# Patient Record
Sex: Male | Born: 1945 | Race: White | Hispanic: No | Smoking: Former smoker
Health system: Southern US, Community
[De-identification: ages and names within clinical notes are randomized; demographics above are authoritative.]

## PROBLEM LIST (undated history)

## (undated) DIAGNOSIS — J310 Chronic rhinitis: Secondary | ICD-10-CM

## (undated) DIAGNOSIS — J4520 Mild intermittent asthma, uncomplicated: Secondary | ICD-10-CM

## (undated) DIAGNOSIS — J449 Chronic obstructive pulmonary disease, unspecified: Secondary | ICD-10-CM

## (undated) HISTORY — DX: Chronic obstructive pulmonary disease, unspecified: J44.9

## (undated) HISTORY — DX: Mild intermittent asthma, uncomplicated: J45.20

## (undated) HISTORY — DX: Chronic rhinitis: J31.0

---

## 1999-03-05 ENCOUNTER — Emergency Department (HOSPITAL_COMMUNITY): Admission: EM | Admit: 1999-03-05 | Discharge: 1999-03-06 | Payer: Self-pay | Admitting: Emergency Medicine

## 1999-03-06 ENCOUNTER — Encounter: Payer: Self-pay | Admitting: Emergency Medicine

## 2000-04-11 ENCOUNTER — Encounter: Payer: Self-pay | Admitting: Emergency Medicine

## 2000-04-11 ENCOUNTER — Inpatient Hospital Stay (HOSPITAL_COMMUNITY): Admission: EM | Admit: 2000-04-11 | Discharge: 2000-04-13 | Payer: Self-pay | Admitting: Emergency Medicine

## 2000-06-22 ENCOUNTER — Emergency Department (HOSPITAL_COMMUNITY): Admission: EM | Admit: 2000-06-22 | Discharge: 2000-06-22 | Payer: Self-pay | Admitting: Emergency Medicine

## 2000-06-22 ENCOUNTER — Encounter: Payer: Self-pay | Admitting: Emergency Medicine

## 2000-09-06 ENCOUNTER — Encounter: Admission: RE | Admit: 2000-09-06 | Discharge: 2000-09-06 | Payer: Self-pay | Admitting: Internal Medicine

## 2001-09-20 ENCOUNTER — Encounter: Payer: Self-pay | Admitting: Emergency Medicine

## 2001-09-20 ENCOUNTER — Inpatient Hospital Stay (HOSPITAL_COMMUNITY): Admission: EM | Admit: 2001-09-20 | Discharge: 2001-09-25 | Payer: Self-pay | Admitting: Emergency Medicine

## 2002-12-09 ENCOUNTER — Encounter: Payer: Self-pay | Admitting: Emergency Medicine

## 2002-12-09 ENCOUNTER — Emergency Department (HOSPITAL_COMMUNITY): Admission: EM | Admit: 2002-12-09 | Discharge: 2002-12-09 | Payer: Self-pay | Admitting: Emergency Medicine

## 2003-01-27 ENCOUNTER — Encounter: Payer: Self-pay | Admitting: Emergency Medicine

## 2003-01-27 ENCOUNTER — Inpatient Hospital Stay (HOSPITAL_COMMUNITY): Admission: EM | Admit: 2003-01-27 | Discharge: 2003-02-01 | Payer: Self-pay | Admitting: Emergency Medicine

## 2004-02-04 ENCOUNTER — Inpatient Hospital Stay (HOSPITAL_COMMUNITY): Admission: EM | Admit: 2004-02-04 | Discharge: 2004-02-06 | Payer: Self-pay | Admitting: Emergency Medicine

## 2005-05-17 ENCOUNTER — Emergency Department (HOSPITAL_COMMUNITY): Admission: EM | Admit: 2005-05-17 | Discharge: 2005-05-17 | Payer: Self-pay | Admitting: Emergency Medicine

## 2005-11-29 ENCOUNTER — Emergency Department (HOSPITAL_COMMUNITY): Admission: EM | Admit: 2005-11-29 | Discharge: 2005-11-29 | Payer: Self-pay | Admitting: Family Medicine

## 2006-01-24 ENCOUNTER — Emergency Department (HOSPITAL_COMMUNITY): Admission: EM | Admit: 2006-01-24 | Discharge: 2006-01-24 | Payer: Self-pay | Admitting: Family Medicine

## 2006-02-11 ENCOUNTER — Inpatient Hospital Stay (HOSPITAL_COMMUNITY): Admission: EM | Admit: 2006-02-11 | Discharge: 2006-02-13 | Payer: Self-pay | Admitting: Emergency Medicine

## 2006-02-11 ENCOUNTER — Encounter (INDEPENDENT_AMBULATORY_CARE_PROVIDER_SITE_OTHER): Payer: Self-pay | Admitting: Specialist

## 2006-09-07 ENCOUNTER — Ambulatory Visit: Payer: Self-pay | Admitting: Internal Medicine

## 2006-12-20 ENCOUNTER — Emergency Department (HOSPITAL_COMMUNITY): Admission: EM | Admit: 2006-12-20 | Discharge: 2006-12-20 | Payer: Self-pay | Admitting: Family Medicine

## 2007-02-13 ENCOUNTER — Ambulatory Visit: Payer: Self-pay | Admitting: Internal Medicine

## 2007-03-16 ENCOUNTER — Ambulatory Visit: Payer: Self-pay | Admitting: Internal Medicine

## 2007-04-27 ENCOUNTER — Ambulatory Visit: Payer: Self-pay | Admitting: Internal Medicine

## 2007-06-18 ENCOUNTER — Emergency Department (HOSPITAL_COMMUNITY): Admission: EM | Admit: 2007-06-18 | Discharge: 2007-06-18 | Payer: Self-pay | Admitting: Emergency Medicine

## 2007-07-22 ENCOUNTER — Emergency Department (HOSPITAL_COMMUNITY): Admission: EM | Admit: 2007-07-22 | Discharge: 2007-07-22 | Payer: Self-pay | Admitting: Emergency Medicine

## 2007-10-09 ENCOUNTER — Telehealth (INDEPENDENT_AMBULATORY_CARE_PROVIDER_SITE_OTHER): Payer: Self-pay | Admitting: *Deleted

## 2007-10-09 ENCOUNTER — Encounter: Payer: Self-pay | Admitting: Internal Medicine

## 2007-10-11 HISTORY — PX: CHOLECYSTECTOMY: SHX55

## 2007-10-12 ENCOUNTER — Telehealth (INDEPENDENT_AMBULATORY_CARE_PROVIDER_SITE_OTHER): Payer: Self-pay | Admitting: *Deleted

## 2007-12-28 ENCOUNTER — Ambulatory Visit: Payer: Self-pay | Admitting: Internal Medicine

## 2007-12-28 DIAGNOSIS — J310 Chronic rhinitis: Secondary | ICD-10-CM

## 2007-12-28 DIAGNOSIS — J4550 Severe persistent asthma, uncomplicated: Secondary | ICD-10-CM

## 2008-01-17 ENCOUNTER — Ambulatory Visit: Payer: Self-pay | Admitting: Internal Medicine

## 2008-09-19 ENCOUNTER — Telehealth (INDEPENDENT_AMBULATORY_CARE_PROVIDER_SITE_OTHER): Payer: Self-pay

## 2010-12-04 ENCOUNTER — Inpatient Hospital Stay (HOSPITAL_COMMUNITY)
Admission: EM | Admit: 2010-12-04 | Discharge: 2010-12-05 | DRG: 192 | Discharge status: Against Medical Advice | Payer: Self-pay | Attending: Family Medicine | Admitting: Family Medicine

## 2010-12-04 ENCOUNTER — Emergency Department (HOSPITAL_COMMUNITY): Payer: Self-pay

## 2010-12-04 DIAGNOSIS — Z87891 Personal history of nicotine dependence: Secondary | ICD-10-CM

## 2010-12-04 DIAGNOSIS — J441 Chronic obstructive pulmonary disease with (acute) exacerbation: Principal | ICD-10-CM | POA: Diagnosis present

## 2010-12-04 DIAGNOSIS — Z9119 Patient's noncompliance with other medical treatment and regimen: Secondary | ICD-10-CM

## 2010-12-04 DIAGNOSIS — I959 Hypotension, unspecified: Secondary | ICD-10-CM | POA: Diagnosis present

## 2010-12-04 DIAGNOSIS — Z91199 Patient's noncompliance with other medical treatment and regimen due to unspecified reason: Secondary | ICD-10-CM

## 2010-12-04 DIAGNOSIS — D72829 Elevated white blood cell count, unspecified: Secondary | ICD-10-CM | POA: Diagnosis present

## 2010-12-04 LAB — LACTIC ACID, PLASMA: Lactic Acid, Venous: 3.5 mmol/L — ABNORMAL HIGH (ref 0.5–2.2)

## 2010-12-04 LAB — POCT I-STAT 3, ART BLOOD GAS (G3+)
O2 Saturation: 90 %
TCO2: 25 mmol/L (ref 0–100)
pO2, Arterial: 61 mmHg — ABNORMAL LOW (ref 80.0–100.0)

## 2010-12-04 LAB — D-DIMER, QUANTITATIVE: D-Dimer, Quant: 1.76 ug/mL-FEU — ABNORMAL HIGH (ref 0.00–0.48)

## 2010-12-04 LAB — DIFFERENTIAL
Basophils Absolute: 0 10*3/uL (ref 0.0–0.1)
Basophils Relative: 0 % (ref 0–1)
Eosinophils Absolute: 0.4 10*3/uL (ref 0.0–0.7)
Eosinophils Relative: 3 % (ref 0–5)
Lymphocytes Relative: 8 % — ABNORMAL LOW (ref 12–46)
Lymphs Abs: 1.1 10*3/uL (ref 0.7–4.0)
Monocytes Absolute: 0.3 10*3/uL (ref 0.1–1.0)
Monocytes Relative: 2 % — ABNORMAL LOW (ref 3–12)
Neutro Abs: 11.7 10*3/uL — ABNORMAL HIGH (ref 1.7–7.7)
Neutrophils Relative %: 87 % — ABNORMAL HIGH (ref 43–77)

## 2010-12-04 LAB — POCT CARDIAC MARKERS
CKMB, poc: 1.5 ng/mL (ref 1.0–8.0)
Troponin i, poc: <0.05 ng/mL (ref 0.00–0.09)

## 2010-12-04 LAB — CBC
Hemoglobin: 14.9 g/dL (ref 13.0–17.0)
MCH: 29.6 pg (ref 26.0–34.0)
MCHC: 33.2 g/dL (ref 30.0–36.0)
RBC: 5.04 MIL/uL (ref 4.22–5.81)

## 2010-12-04 LAB — COMPREHENSIVE METABOLIC PANEL
Albumin: 3.7 g/dL (ref 3.5–5.2)
Calcium: 8.6 mg/dL (ref 8.4–10.5)
Potassium: 4.3 mEq/L (ref 3.5–5.1)
Sodium: 140 mEq/L (ref 135–145)
Total Protein: 6.5 g/dL (ref 6.0–8.3)

## 2010-12-04 LAB — URINALYSIS, ROUTINE W REFLEX MICROSCOPIC
Bilirubin Urine: NEGATIVE
Nitrite: NEGATIVE
Urobilinogen, UA: 0.2 mg/dL (ref 0.0–1.0)

## 2010-12-04 LAB — POCT I-STAT, CHEM 8
Calcium, Ion: 1.04 mmol/L — ABNORMAL LOW (ref 1.12–1.32)
Creatinine, Ser: 1.2 mg/dL (ref 0.4–1.5)
HCT: 44 % (ref 39.0–52.0)
Potassium: 4.2 mEq/L (ref 3.5–5.1)

## 2010-12-04 LAB — TROPONIN I
Troponin I: 0.04 ng/mL (ref 0.00–0.06)
Troponin I: 0.14 ng/mL — ABNORMAL HIGH (ref 0.00–0.06)

## 2010-12-04 LAB — BRAIN NATRIURETIC PEPTIDE: Pro B Natriuretic peptide (BNP): 54 pg/mL (ref 0.0–100.0)

## 2010-12-04 LAB — CK TOTAL AND CKMB (NOT AT ARMC): CK, MB: 3.6 ng/mL (ref 0.3–4.0)

## 2010-12-05 ENCOUNTER — Inpatient Hospital Stay (HOSPITAL_COMMUNITY): Payer: Self-pay

## 2010-12-05 DIAGNOSIS — R079 Chest pain, unspecified: Secondary | ICD-10-CM

## 2010-12-05 LAB — CARDIAC PANEL(CRET KIN+CKTOT+MB+TROPI)
Relative Index: INVALID (ref 0.0–2.5)
Total CK: 58 U/L (ref 7–232)
Troponin I: 0.12 ng/mL — ABNORMAL HIGH (ref 0.00–0.06)

## 2010-12-05 LAB — COMPREHENSIVE METABOLIC PANEL
ALT: 15 U/L (ref 0–53)
AST: 24 U/L (ref 0–37)
BUN: 12 mg/dL (ref 6–23)
Calcium: 8.7 mg/dL (ref 8.4–10.5)
GFR calc Af Amer: >60 mL/min (ref >60)
Potassium: 3.6 mEq/L (ref 3.5–5.1)
Total Bilirubin: 0.4 mg/dL (ref 0.3–1.2)
Total Protein: 5.4 g/dL — ABNORMAL LOW (ref 6.0–8.3)

## 2010-12-05 LAB — CBC
MCH: 28.8 pg (ref 26.0–34.0)
WBC: 9.6 10*3/uL (ref 4.0–10.5)

## 2010-12-05 LAB — LACTIC ACID, PLASMA: Lactic Acid, Venous: 5 mmol/L — ABNORMAL HIGH (ref 0.5–2.2)

## 2010-12-05 LAB — TSH: TSH: 0.57 u[IU]/mL (ref 0.350–4.500)

## 2010-12-06 LAB — URINE CULTURE

## 2010-12-06 NOTE — Consult Note (Signed)
NAME:  NEO, YEPIZ              ACCOUNT NO.:  [id number]  MEDICAL RECORD NO.:  [id number]           PATIENT TYPE:  I  LOCATION:  4707                         FACILITY:  [facility]  PHYSICIAN:  Noralyn Pick. Eden Emms, MD, FACCDATE OF BIRTH:  06/04/1946  DATE OF CONSULTATION: DATE OF DISCHARGE:  12/05/2010                                CONSULTATION   Mr. Billy [name] is a 64-year patient we are asked to see for chest pain. He was admitted last night for shortness of breath.  The patient has a longstanding history of COPD and is followed by Dr. Shelle Iron and [name]. He is a previous smoker.  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  He subsequently says he got anxious and had chest pressure in the setting of his wheezing and shortness of breath.  He also became somewhat diaphoretic.  EMS was called, then he came to the ER.  He had significant improvement with bronchodilator treatments; however, he was admitted to the hospital due to a positive troponin and high lactic acid level.  The patient is currently pain free.  He still has some mild wheezing. He has no insurance and wants to go home.  His D-dimer was mildly elevated and the primary service ordered a CT angiogram, which the patient refused to go down for.  The patient has no previous history of cardiac problems.  He has no exertional chest pain.  Coronary risk factors include previous smoking.  The patient denies palpitations, syncope, lower extremity edema.  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  His 10-point review of systems in otherwise negative.  In particular, he has not had sputum production or fever.  He indicates that he usually has a good response with prednisone.  PAST MEDICAL HISTORY:  Remarkable for COPD.  PREVIOUS SURGICAL HISTORY:  Cholecystectomy.  Only medications taken prior to admission were albuterol p.r.n.  FAMILY  HISTORY:  Negative for premature coronary artery disease.  The patient quit smoking in 2010.  He works at a Estate manager/land agent.  He denies current alcohol or drug use.  He is sedentary.  He does not have insurance.  He has no known allergies.  PHYSICAL EXAMINATION:  GENERAL:  Remarkable for middle-aged male in no distress. VITAL SIGNS:  Current blood pressure is 130/59, pulse 81 and regular, afebrile, 95% room air sats. HEENT:  Unremarkable. NECK:  Carotids upstrokes are normal without bruit.  No lymphadenopathy, thyromegaly, or JVP elevation. LUNGS:  Have mid-to-end-expiratory wheezes.  Diaphragmatic motion is normal. CARDIAC:  S1-S2, normal heart sounds.  PMI normal. ABDOMEN:  Benign.  Bowel sounds positive.  No AAA.  No tenderness.  No bruit.  No hepatosplenomegaly, hepatojugular reflux, or tenderness. Status post cholecystectomy. EXTREMITIES:  Distal pulses are intact.  No edema. NEUROLOGIC:  Nonfocal. SKIN:  Warm and dry.  No muscular weakness.  Potassium level 3.6, BUN 12, creatinine 1.  Cardiac panel shows a troponin of 0.12, but CPK 58 with negative MB and CPK of 70 and 3.6.  BNP is 54.  EKG shows sinus rhythm and is normal.  Initially, somewhat tachycardic, no evidence acute ischemic changes.  Chest x-ray shows chronically prominent markings at the lung bases.  No acute pneumonia or CHF.  White count 9.6, hematocrit 36.7.  IMPRESSION:  Clinically, the patient has had a chronic obstructive pulmonary disease exacerbation.  He has responded to bronchodilators, pressure was only in the setting of an anxiety attack because he could not breath.  CPKs and EKG are normal.  Exam is unremarkable and the patient is improved with treatment for his asthmatic component.  Particularly since he has no insurance and wants to be discharge before the time period for 24-hour observation, I think it is fine to discharge the patient home, I do not think he needs further cardiac workup.   I would send him home on a pred pack or steroid taper and follow up with Dr. Shelle Iron and [name].  He was encouraged to avoid overheating and to set of fan or thermostat while at work.  These recommendations were discussed with Dr. Mena Pauls from the Primary Service.     Noralyn Pick. Eden Emms, MD, [facility]     PCN/MEDQ  D:  12/05/2010  T:  12/05/2010  Job:  [id number]  Electronically Signed by Charlton Haws MD [facility] on 12/06/2010 05:00:35 PM

## 2010-12-06 NOTE — H&P (Signed)
NAME:  Billy [name], Billy [name] NO.:  [id number]  MEDICAL RECORD NO.:  [id number]           PATIENT TYPE:  E  LOCATION:  [facility]                         FACILITY:  [facility]  PHYSICIAN:  Eduard Clos, MDDATE OF BIRTH:  [birth year]  DATE OF ADMISSION:  12/04/2010 DATE OF DISCHARGE:                             HISTORY & PHYSICAL   PRIMARY CARE PHYSICIAN:  Unassigned.  PRIMARY PULMONOLOGIST:  Charlaine Dalton. [name], MD, FCCP  CHIEF COMPLAINT:  Shortness of breath.  HISTORY OF PRESENT ILLNESS:  A 65 year old male with history of COPD and history of cigarette smoking, who presents with sudden onset of chest compression and shortness of breath with diaphoresis.  The patient states he was doing perfectly fine until 3:30 evening and he suddenly developed shortness of breath, chest compression as of his chest is being squeezed, and he became diaphoretic.  All incident lasts for around half an hour.  He came to the ER, by the time he got couple of treatments, he got better.  At this time, the patient is found to be mildly hypotensive, lactic acid is high, and the patient will be admitted for further observation.  The patient denies any cough or phlegm or fever or chills.  Denies any chest pain at this time.  Denies any abdominal pain, nausea, vomiting, diarrhea, dysuria, discharge, or any dizziness, loss of consciousness or focal deficit.  PAST MEDICAL HISTORY:  COPD.  PAST SURGICAL HISTORY:  Cholecystectomy.  MEDICATIONS PRIOR TO ADMISSION:  He takes albuterol p.r.n.  FAMILY HISTORY:  Patient states is negative for any diabetes, hypertension, stroke, or cancer.  SOCIAL HISTORY:  The patient quit smoking 2 years ago.  Denies any alcohol or drug abuse.  REVIEW OF SYSTEMS:  As per history of presenting illness, nothing else significant.  ALLERGIES:  NO KNOWN DRUG ALLERGIES.  PHYSICAL EXAMINATION:  GENERAL:  The patient examined at bedside, not in acute distress. VITAL  SIGNS:  Blood pressure 90/70, pulse is 100 per minute, temperature 97.8, respirations 18, O2 sat 97%. HEENT:  Anicteric.  No pallor.  No discharge from ears, eyes, nose or mouth.  No facial asymmetry.  Tongue is midline. CHEST:  Bilateral air entry present. Bilateral expiratory wheezes. No crepitation or adventitious sounds heard. ABDOMEN:  Soft, nontender.  Bowel sounds heard. CNS:  The patient is alert, awake, and oriented to time, place, and person.  Moves upper and lower extremities, 5/5. EXTREMITIES:  Peripheral pulses felt.  No edema.  LABORATORY DATA:  I ordered an EKG.  Chest x-ray shows chronically prominent markings of the lung bases unchanged from multiple previous exam, therefore, acute pneumonia is not suspected.  CBC; WBC is 13.4, hemoglobin is 15, hematocrit is 44, platelets 234,000, neutrophils 87%. Complete metabolic panel; sodium 142, potassium 4.2, chloride 106, carbon dioxide 26, glucose 121, BUN 18, creatinine 1.2, total bilirubin is 0.2, alkaline phosphatase is 85, AST 23, albumin 3.5, calcium 8.6, lactic acid is 3.5, troponin-I is 0.04, and less than 0.05.  BNP is 54.  Blood cultures are pending.  ASSESSMENT: 1. Shortness of breath, most likely could be a chronic obstructive     pulmonary  disease exacerbation. 2. Chest discomfort, rule out acute coronary syndrome. 3. Hypotension, unclear etiology. 4. Leukocytosis.  PLAN: 1. At this time, I am going to admit the patient to telemetry. 2. For his hypotension, I am going to give a liter bolus followed by     fluid infusion.  If he continues to be hypotensive, then I will     consider transferring him to step-down, as now, the patient does     not look very toxic, empirically start the patient on     Avelox for now.  We will get blood cultures, urine cultures,      We will get a CT angio chest to rule out PE. 3. Chest discomfort.  At this time, the patient is free of any chest     pain.  I will keep the patient  on aspirin, cycle cardiac markers,     got EKG, got a 2D echo. 4. Further recommendation based on the test order and clinical course.     Eduard Clos, MD     ANK/MEDQ  D:  12/04/2010  T:  12/04/2010  Job:  [id number]  Electronically Signed by Midge Minium MD on 12/06/2010 05:09:10 PM

## 2010-12-10 LAB — CULTURE, BLOOD (ROUTINE X 2): Culture  Setup Time: 201202252105

## 2010-12-11 LAB — CULTURE, BLOOD (ROUTINE X 2): Culture: NO GROWTH

## 2010-12-16 NOTE — Discharge Summary (Signed)
NAME:  Billy [name], Billy [name] NO.:  [id number]  MEDICAL RECORD NO.:  [id number]           PATIENT TYPE:  I  LOCATION:  4707                         FACILITY:  [facility]  PHYSICIAN:  Brendia Sacks, MD    DATE OF BIRTH:  12/28/1945  DATE OF ADMISSION:  12/04/2010 DATE OF DISCHARGE:  12/05/2010                              DISCHARGE SUMMARY   The patient left against medical advice, February 26.  PRIMARY CARE PHYSICIAN:  The patient has none.  PRIMARY PULMONOLOGIST:  Charlaine Dalton. [name], MD, FCCP  BRIEF SUMMARY:  This is a 65 year old man who presented to the emergency room with shortness of breath and chest tightness.  He had been doing well at work when all of a sudden he became quite hot, diaphoretic, and had acute shortness of breath and chest tightness.  He felt like he was having a panic attack and this was made worse by fact his albuterol inhaler was empty.  He presented to the emergency room and felt better after nebulizer treatments.  However, he was noted to have marked abnormalities including a positive D-dimer as well as a markedly elevated lactic acid.  He was also mildly hypotensive in the emergency room.  He was therefore admitted for further evaluation and treatment to exclude several acute issues.  The patient remained stable overnight and felt quite well the morning of February 26.  He was seen in consultation with Cardiology who noted his mildly elevated troponin but felt he had no significant EKG changes and felt no further evaluation was required.  I reviewed the patient's studies which included an unremarkable chest x-ray and EKG was unremarkable.  However, his laboratory studies were notable for a lactic acid of 3.5 which then this morning was 5.0, although procalcitonin was negative.  His D-dimer was noted to be elevated at 1.76 and his cardiac enzymes were notable for troponin peak of 0.14, most recently 0.12 this morning with normal CK and  CK-MB.  The patient denied any risk factors for venous thromboembolism including recent surgery, immobilization, or personal history of blood clots.  The patient had no chest pain when I saw him this morning and no shortness of breath and wanted to go home.  I discussed his abnormalities including his elevated lactic acid which may indicate severe acute illness such as developing sepsis or other severe illness, noninfectious in nature, as well as mildly elevated troponins and a positive D-dimer concerning for a venous thromboembolism.  I discussed continued observation with further testing including CT angiogram of the chest to rule out pulmonary embolism, 2-D echocardiogram to assess heart function, and bilateral lower extremity venous Dopplers to exclude DVT. I discussed all these in layman's term with him and my rationale for them.  I did not feel it was advisable for discharge today.  However, the patient after careful consideration and discussion with his son has decided to sign out against medical advice.  I have urged him to seek medical attention should he have any recurrent symptoms and he appears to understand and accept the risks of discharge against medical advice.  DISCHARGE DIAGNOSES: 1. Chest pain  and shortness of breath. 2. Noncompliance with chronic obstructive pulmonary disease. 3. Positive troponin. 4. Positive lactic acid. 5. Positive D-dimer.     Brendia Sacks, MD     DG/MEDQ  D:  12/05/2010  T:  12/06/2010  Job:  [id number]  cc:   Charlaine Dalton. [name], MD, [facility]  Electronically Signed by Brendia Sacks  on 12/15/2010 09:05:19 PM

## 2011-02-22 NOTE — Assessment & Plan Note (Signed)
[facility]                             PULMONARY OFFICE NOTE   ARIC, JOST                     MRN:          [id number]  DATE:03/16/2007                            DOB:          1946-02-25    HISTORY:  This is a 65 year old white male who returns for PFTs as  requested, status post remote smoking cessation with minimum COPD  chronically, and an asthmatic component that typically is fueled by  poorly controlled rhinitis/sinusitis.  He has had difficulty with  adherence that he relates previously to being able to afford his  insurance and medicines, but now comes back having misunderstood  instructions that he was given here in the office on Feb 13, 2007 in  terms of how to use Afrin in combination with nasal steroids.  Instead  of using them for 5 days, he has been using them on a daily basis.  However, he said he feels the best he has felt in a long time.  The  only time he felt better than this is when he got prednisone in high  doses for a long period of time from his ears, nose, and throat doctor.   PHYSICAL EXAMINATION:  He is a pleasant, ambulatory, white male, in no  acute distress.  Stable vital signs.  HEENT:  Unremarkable.  Oropharynx clear.  LUNG FIELDS:  Reveal minimal rhonchi bilaterally.  HEART:  Reveals a regular rhythm without murmur, gallop, or rub.  ABDOMEN:  Soft and benign.  EXTREMITIES:  Warm without calf tenderness, cyanosis, clubbing, or  edema.   PFTs were reviewed with the patient.  He had an FEV1 of 70% predicted  with a ratio of 57%.   IMPRESSION:  This patient has an element of chronic obstructive  pulmonary disease with minimum asthma.  Since the cost is such an issue  for him, I believe that we can continue to treat his rhinitis as the  primary problem, and then if he breaks the rule of 2's, add back a  maintenance inhaler.  Because compliance is such an issue, I would  probably pick Symbicort for him.   I went over in very explicit terms, the rule of 2's to understand the  difference between controlled and uncontrolled asthma component.  I also  went over how to use the Afrin more appropriately, and showed him the  same flyer I gave him last time, indicating in very bold letters that he  should only use the Afrin for 5 days.   In addition, I reminded him that we have a 3 to 5 day rule in  pulmonary medicine where he has typically between 3 and 5 days when he  begins to flare, that he can contact this office for followup or same  day appointment if necessary to prevent recurrent trips to the hospital.   Followup here will be every 3 months, sooner if needed.     Charlaine Dalton. [name], MD, [facility]  Electronically Signed    MBW/MedQ  DD: 03/17/2007  DT: 03/17/2007  Job #: [id number]   cc:   Georgann Housekeeper, MD

## 2011-02-22 NOTE — Assessment & Plan Note (Signed)
[facility]                             PULMONARY OFFICE NOTE   NIGUEL, MOURE                     MRN:          [id number]  DATE:04/27/2007                            DOB:          01/07/1946    PULMONARY EXTENDED ACUTE OFFICE VISIT:   HISTORY:  A 65 year old white male who has evidence of mild-to-moderate  COPD but no significant asthma by most recent studies available from  June, 2008 with symptoms that correlate very well with  rhinitis/sinusitis.  For that reason, I emphasized to him twice in  writing that he should maintain a floor of taking one dose of Nasonex  nightly and that if he flared, he should not only double the dose to  b.i.d. but take Afrin for five days.   For reasons that are not clear to me, the patient continues to follow  these instructions and comes in now with a 24-hour history of increasing  dyspnea associated again with nasal congestion with purulent sputum.  His specific request is that we give me a nebulizer because the last  time I had one the benefits lasted five days.   The patient does have a rescue inhaler and actually has not used it at  all today.   PHYSICAL EXAMINATION:  He is a pleasant, ambulatory white male in no  acute distress.  Afebrile with normal vital signs.  HEENT:  Severe turbinate edema with crusting.  The nasal passages are  extremely limited.  Oropharynx, however, is clear with no evidence of  excessive postnasal drainage.  NECK:  Supple without cervical adenopathy or tenderness.  Trachea is  midline.  No thyromegaly.  Lung fields are perfectly clear bilaterally to auscultation and  percussion with excellent air movement.  HEART:  Regular rate and rhythm without murmur, rub or gallop.  ABDOMEN:  Soft, benign.  EXTREMITIES:  Warm without calf tenderness, clubbing, cyanosis or edema.   IMPRESSION:  Chronic obstructive pulmonary disease with an asthmatic  component that is being driven  almost entirely by rhinitis/sinusitis.  Although we could certainly treat the asthma component more  aggressively, I prefer to treat the underlying problem with the thinking  being that if all we do is bronchodilate the airway, although we will be  able to limit the bronchial reflex, we will not be able to solve the  underlying problem and actually may make it worse if he gets direct  injury into his airways from retained purulent secretions in the upper  airway.   Therefore, I recommend the following approach.  I got out the rhinitis  flyer that we previously gave him, and we went over it line by line to  show him where he was mistaken in terms of how he managed his problem  chronically.  Specifically, he is to taper the Nasonex down but not off  and to use Afrin for five straight days for flare-up, as in today.   I gave him the squirt gun analogy of nasal steroids, emphasizing that  to put out a flame with a squirt gun, one needs to aim carefully and  regularly at the base of the flame, and that is not possible to do once  he has an acute flareup with nasal obstruction.   To treat him acutely today, I have recommended Augmentin for 10 days and  prednisone for eight days as well.  If not back to baseline in terms of  symptoms and if any further need for albuterol over baseline, we will  need to see him back here in the office, otherwise I have referred him  back to Dr. [name] for followup.     Charlaine Dalton. [name], MD, [facility]  Electronically Signed    MBW/MedQ  DD: 04/27/2007  DT: 04/28/2007  Job #: [id number]   cc:   Kristine Garbe. [name], M.D.

## 2011-02-25 NOTE — H&P (Signed)
[facility]  Patient:    Billy [name], HARBOR Visit Number: [id number] MRN: [id number]          Service Type: MED Location: 3W [phone] Attending Physician:  Avie Echevaria Dictated by:   Charlaine Dalton. [name], M.D. [facility] Admit Date:  09/20/2001   CC:         Tyson Dense, M.D.   History and Physical  CHIEF COMPLAINT:  Dyspnea.  HISTORY OF PRESENT ILLNESS:  Billy [name] is a 65 year old white male, a remote smoker, whom I have seen previously with intermittent asthma that usually flares up in the setting of rhinitis/sinusitis for which he had been referred to [name]. Billy [name], M.D.  The only set of PFTs that we have on our records from October of 2001 indicate no evidence of air flow obstruction.  He had been able to do well, however, using essentially no medications since he was last seen here in the office in June of 2002, but comes in acutely ill today with a 24-hour history of worsening sinus congestion, sore throat, chest congestion with now a hacking coughing, and generalized diffuse anterior chest discomfort with coughing paroxysms.  He was seen by the emergency room staff and found to have a right lower lobe pneumonia with borderline saturations and increased work of breathing at rest even after albuterol treatment.  He was therefore referred to me for admission Tyson Dense, M.D., is his primary physician).  The patient denies any localized pleuritic chest pain.  His cough is productive of minimal yellow sputum.  He denies any toothache, definite sinus pain, nausea, vomiting, myalgias, arthralgias, leg swelling, or diarrhea.  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  ALLERGIES:  None known.  MEDICATIONS:  He is on no regular medications at all.  SOCIAL HISTORY:  He quit smoking in 1999 after smoking three to four packs per day for 30 years.  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 He denies any unusual travel, pet, or hobby exposure.  He is physically active without dyspnea, but his not aerobically active.  FAMILY HISTORY:  Negative for respiratory diseases.  REVIEW OF SYSTEMS:  Taken in detail from the work sheet and essentially negative except as noted above.  PHYSICAL EXAMINATION:  This is an acutely ill-appearing white male sitting back on the stretcher at less than 30 degrees elevated head of bed.  VITAL SIGNS:  He is afebrile in the emergency room with normal vital signs, except for a respiratory rate of 28 and a pulse rate of 120.  HEENT:  The oropharynx reveals mild erythema.  There are no tonsillar exudates or enlargement, however.  There is no evidence of postnasal drainage.  The nasal turbinates reveal moderate erythema and edema with no evidence of purulent discharge.  Ear canals clear bilaterally.  LUNGS:  The lung fields reveal inspiratory and expiratory rhonchi bilaterally with overall very diminished air movement.  There is a regular rate and rhythm without murmurs, rubs, or gallops present.  ABDOMEN:  Soft and benign with no palpable organomegaly, masses, or tenderness.  EXTREMITIES:  Warm without calf tenderness, clubbing, cyanosis, or edema.  NEUROLOGIC:  No focal deficits.  Reflexes and sensorium were intact.  SKIN:  Warm and dry.  LABORATORY DATA:  The laboratory data available to me was consistent with a bronchopneumonia involving the right lower lobe anterior basal segment.  He had a white count of 16,700 with a left  shift and normal otherwise.  The EKG was normal.  His hemoglobin saturation on nasal oxygen at 2 L is running in the low 90s and was in the 80s on room air.  IMPRESSION:  Acute tracheobronchitis in a patient with intermittent asthma that typically flares with rhinitis/sinusitis.  It has now flared with what appears to be a bacterial bronchopneumonia involving the right lower lobe complicated by hypoxemia  respiratory failure at rest.  Given the fact that he has increased work of breathing with evidence of persistent rhonchi despite treatment with a nebulizer here in the hospital and borderline saturations, I am going to recommend that he be admitted to the hospital for treatment which will include IV bronchodilators, IV steroids, IV Tequin, and around-the-clock bronchodilators. Dictated by:   Charlaine Dalton. [name], M.D. [facility] Attending Physician:  Avie Echevaria DD:  09/20/01 TD:  09/20/01 Job: [id number]

## 2011-02-25 NOTE — Discharge Summary (Signed)
[place]. [facility]  Patient:    SEVERO, BEBER                     MRN: [id number] Adm. Date:  [id number] Disc. Date: 04/13/00 Attending:  Darnelle Bos CC:         Winn Jock. Earl Gala, M.D.                           Discharge Summary  ADMITTING DIAGNOSIS:  Asthmatic attack.  DISCHARGE DIAGNOSES: 1. Acute asthmatic attack. 2. Asthma and probable allergic rhinitis. 3. Evidence of chronic obstructive pulmonary disease by chest x-ray. 4. History of steroid abuse. 5. History of nephrectomy.  Please see admitting History and Physical for admitting details.  HOSPITAL COURSE:  The patient was admitted and treated with IV Solu-Medrol and albuterol nebulizers.  No antibiotics were added as there was no significant evidence of infection.  Over the subsequent 24-36 hours, his wheezing improved considerably and he was able to rest reasonably well.  He was able to ambulate without excessive shortness of breath and was discharged in improved condition.  DISCHARGE MEDICATIONS: 1. Pepcid AC one tablet b.i.d. x 2 weeks. 2. Allegra 180 one daily. 3. Prednisone 20 mg two daily for one week, then one daily for one week. 4. Advair 250/50 one inhalation every 12 hours. 5. Albuterol inhaler two puffs q.i.d. p.r.n.  ACTIVITY:  No restrictions.  SPECIAL INSTRUCTIONS:  He may return to work on 04/17/00.  He was instructed to call if he developed any significant problems with wheezing or shortness of breath.  DIET:  No restrictions.  FOLLOWUP:  He will call to make an appointment to see me in approximately two weeks. DD:  04/13/00 TD:  04/13/00 Job: [id number]

## 2011-02-25 NOTE — H&P (Signed)
[place]. [facility]  Patient:    Billy [name], Billy [name]                     MRN: [id number] Adm. Date:  [id number] Attending:  Darnelle Bos                         History and Physical  CHIEF COMPLAINT: Billy [name] is a 65 year old white male admitted with chronic obstructive pulmonary disease exacerbation versus asthma.  HISTORY OF PRESENT ILLNESS: Approximately three months ago he began to have problems with nasal congestion, ear fullness, and intermittent chest congestion.  These symptoms actually finally resolved, but a week ago the pains began to worsen.  He went to the [place] [facility] at [facility], where he saw a P.A.  He was given a Z-pak when his chest x-ray looked good.  He finished it last night.  At approximately midnight last night he suddenly developed shortness of breath and also became hot and sweaty, and literally had to stand up to breathe more comfortably.  He came to the emergency room via ambulance.  He has received several breathing treatments, and is feeling better.  He smoked 3-1/2 to 5 packs of cigarettes per day from age 49 to age 67, and quit cold [place] at that time.  He has had no breathing problems since he quit smoking until the last three months.  He has also noted some sinus fullness and nasal congestion this evening.  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  PAST MEDICAL HISTORY: Negative.  MEDICATIONS: None.  ALLERGIES: No known drug allergies.  SOCIAL HISTORY: Cigarettes, see above.  Alcohol, infrequent.  He is Customer service manager.  FAMILY HISTORY: Father died of complications of apparent legionnaires disease. Mother alive and well at approximately age 65.  Brother alive and well.  REVIEW OF SYSTEMS: Otherwise negative.  PHYSICAL EXAMINATION:  VITAL SIGNS: Blood pressure 143/83, pulse 100, respiratory rate 24.  HEENT: PERRL.  EOMI.  Funduscopic  examination is normal.  Ears normal.  Nose and throat unremarkable except for slightly poor dentition.  NECK: Supple.  Thyroid not enlarged or tender.  CHEST: Decreased breath sounds, with a few squeaks around.  CARDIAC: Normal.  ABDOMEN: Negative, with normal bowel sounds.  No hepatosplenomegaly or mass.  EXTREMITIES: Without clubbing, cyanosis, or edema.  NEUROLOGIC: Oriented x 3.  Cranial nerves intact.  Motor is 5/5.  LABORATORY DATA: Chest x-ray shows mild hyperinflation without infiltrate.  WBC 12,000, hemoglobin normal.  CMET is pending.  IMPRESSION/PLAN:  1. Asthma and allergic rhinitis.  This appears to be recurring rhinitis     with asthma.  This is probably superimposed on top of some chronic     obstructive pulmonary disease.  Will treat him with intravenous     Solu-Medrol and nebulizers today and then switch to meter dose inhalers     and prednisone tomorrow.  No antibiotic at this point.  Will also add     Allegra.  2. Chronic obstructive pulmonary disease by chest x-ray.  Will need to have     pulmonary function tests done after hospitalization.  3. History of cigarette abuse.  4. Status post nephrectomy. DD:  04/11/00 TD:  04/11/00 Job: [id number]

## 2011-02-25 NOTE — H&P (Signed)
NAME:  Billy [name], Billy [name] NO.:  [id number]   MEDICAL RECORD NO.:  [id number]                   PATIENT TYPE:  EMS   LOCATION:  ED                                   FACILITY:  [facility]   PHYSICIAN:  Oley Balm. Sung Amabile, M.D. [facility]          DATE OF BIRTH:  [birth year]   DATE OF ADMISSION:  01/27/2003  DATE OF DISCHARGE:                                HISTORY & PHYSICAL   CHIEF COMPLAINT:  Shortness of breath, cough, and wheezing.   HISTORY OF PRESENT ILLNESS:  The patient is a 65 year old former smoker of  two packs per day for 40 years who quit 10 years ago.  He was admitted in  the past for acute exacerbation of chronic obstructive pulmonary disease and  found to have sinusitis.  Was evaluated by Suzanna Obey, M.D.  He refused  surgery at that time.  He presents today with a four day history of  increasing shortness of breath.  Shortness of breath is with any activity.  He also notes sinus drainage, chest congestion.  No wheeze.  He has proven  refractory to treatment in emergency room and will be admitted for further  evaluation and treatment.   PAST MEDICAL HISTORY:  1. Left nephrectomy for hydronephrosis at age 7.  2. Chronic obstructive pulmonary disease.   ALLERGIES:  None.   MEDICATIONS:  Albuterol p.r.n. puffer which he uses approximately once a  month.   SOCIAL HISTORY:  He was a smoker, but quit in 1999 after smoking three to  four packs a day for 30 years.  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  He is non-active.   FAMILY HISTORY:  Negative for respiratory diseases.   REVIEW OF SYSTEMS:  Taken in detail and essentially unremarkable.   LABORATORY DATA:  WBC 12.6, hemoglobin 15.1, hematocrit 43.8, platelets  305,000.  Sodium 141, potassium 3.4, chloride 106, CO2 30, glucose 130, BUN  18, creatinine 1.1, calcium 10.6.  Chest x-ray shows no acute disease.   PHYSICAL EXAMINATION:  VITAL SIGNS:   Blood pressure originally 213/117, now  is 140/80, heart rate 112, respirations 18, temperature 96.7, O2 saturations  on admission 81% on room air, currently are 93% on 2 L nasal cannula.   IMPRESSION AND PLAN:  Acute exacerbation of chronic obstructive pulmonary  disease with questionable sinusitis admitted with usual pharmaceutical  regimens including of intravenous steroids, intravenous antibiotics,  handheld nebulizers along with nasal hygiene since he has a history of  sinusitis.  Will also have chest x-ray along with sinus films to evaluate  history of sinusitis.   MEDICATIONS:  1. Albuterol 2.5 handheld nebulizer q.6h. or q.3h. p.r.n.  2. Atrovent 0.5 mg handheld nebulizer q.6h.  3. Solu-Medrol 80 mg IV q.8h.  4. Avelox 400 mg IV daily.  5. Protonix 40 mg daily.  6. Afrin nasal spray.  7. Flonase nasal spray.  8. Nasal salt water per standard regimen.  9. Ambien 10 mg q.h.s. p.r.n.  10.      Tylenol 650 mg p.o. q.i.d.     Billy [name], A.C.N.P. [facility]                 Oley Balm. Sung Amabile, M.D. [facility]    SM/MEDQ  D:  01/27/2003  T:  01/27/2003  Job:  [id number]   cc:   Georgann Housekeeper, M.D.  [address]  [place]  [place] [address]  Fax: [phone]   Suzanna Obey, M.D.  [address] [place]  [place] [address]  Fax: [phone]

## 2011-02-25 NOTE — Discharge Summary (Signed)
[facility]  Patient:    Billy [name], Billy [name] Visit Number: [id number] MRN: [id number]          Service Type: MED Location: 3W [phone] Attending Physician:  Avie Echevaria Dictated by:   Charlaine Dalton. [name], M.D. [facility] Admit Date:  09/20/2001 Discharge Date: 09/25/2001   CC:         Billy Banda. Jearld [name], M.D., ENT                           Discharge Summary  FINAL DIAGNOSIS:   Acute asthmatic bronchitis secondary to acute-on-chronic rhinitis/sinusitis with possible right lower lobe pneumonia.  Pansinusitis documented by a CT scan this admission with acute-on-chronic features. Blood cultures negative, no sputum obtained.  HISTORY OF PRESENT ILLNESS:  This is a 65 year old white male remote smoker with a previous documented nonobstructive PFTs with intermittent episodes of asthmatic bronchitis that I had previously felt were probably related to sinusitis and he had been seen by Dr. Jearld [name] but never required surgery.  He was admitted through the emergency room on December 12 with a less than 24-hour history of worsening sinus congestion, sore throat, and chest congestion, with severe coughing paroxysms and was felt to have a possible right lower lobe pneumonia with desaturations on room air.  Although he had very poor air movement, he had no exam findings to suggest consolidation and chest x-ray was read as possible bronchiectasis involving the anterior basal segment of the right lower lobe.  He had an initial white count of 16,000 with a left shift.  His T max in the hospital was around 100 degrees.  He responded slowly to empiric antibiotic therapy with Tequin and, once it was realized he had evidence of pansinusitis, this was switched over to Augmentin, so he received a total of 5 of 14 planned days of therapy.  He was treated with IV Solu-Medrol that was switched over to prednisone the day prior to discharge and switched from nebulizers over to Advair also the  day prior to discharge.  On the day of discharge, he as saturating well on room air, comfortable with no further coughing.  His sinus congestion was addressed with Afrin plus Nasarel and he will be discharged on Nasarel two puffs b.i.d. with follow-up in the office to be sent back to Dr. Jearld [name] once we have his acute exacerbation resolved.  He is therefore discharged in improved condition on the following medications: 1. Protonix 40 mg daily. 2. Guaifenesin DM two b.i.d. 3. Nasarel two puffs b.i.d. each nostril. 4. Augmentin 875 b.i.d. for an additional nine days. 5. Advair 250/50 one b.i.d. 6. Prednisone 20 mg two each morning for two days, one each morning for two    days, and then one-half in the morning until seen. 7. For wheezing symptomatically, he can take Combivent two puffs every 4 hours    as needed.  He is to see our nurse practitioner on December 26 or December 27 and then see me in three weeks for referral back to Dr. Jearld [name] depending on his progress. He will need a follow-up chest x-ray, as well, in the office.  His diet and activity are to be routine. Dictated by:   Charlaine Dalton. [name], M.D. [facility] Attending Physician:  Avie Echevaria DD:  09/25/01 TD:  09/25/01 Job: [id number]

## 2011-02-25 NOTE — Discharge Summary (Signed)
NAMEFOCH, ROSENWALD NO.:  [id number]   MEDICAL RECORD NO.:  [id number]          PATIENT TYPE:  INP   LOCATION:  1603                         FACILITY:  [facility]   PHYSICIAN:  Velora Heckler, MD      DATE OF BIRTH:  03/10/1946   DATE OF ADMISSION:  02/10/2006  DATE OF DISCHARGE:  02/13/2006                                 DISCHARGE SUMMARY   REASON FOR ADMISSION:  Acute cholecystitis, abdominal pain.   HISTORY OF PRESENT ILLNESS:  The patient is 65 year old white male from  [place], [place].  He presents to the emergency department with  abdominal pain in the right upper quadrant.  The patient was evaluated and  noted to have an elevated white blood cell count.  The patient underwent  ultrasound which showed a thick-walled gallbladder with multiple gallstones.   HOSPITAL COURSE:  The patient was seen and evaluated in the emergency  department on May4, 2007.  He was prepared and taken to the operating room  on May5.  He underwent laparoscopic cholecystectomy with intraoperative  cholangiography.  Postoperatively, the patient received intravenous  antibiotics for 48 hours.  His diet was slowly advanced.  He was prepared  for discharge home on the second postoperative day.   DISCHARGE PLANNING:  The patient is discharged home today, Feb 13, 2006, in  good condition, tolerating a regular diet and ambulating independently.  The  patient will be seen back in my office at [facility] in 2  weeks.  Discharge medications include Vicodin as needed for pain and Milk of  Magnesia as needed for constipation.   FINAL DIAGNOSIS:  Acute cholecystitis, cholelithiasis.   CONDITION AT DISCHARGE:  Improved.      Velora Heckler, MD  Electronically Signed     TMG/MEDQ  D:  02/13/2006  T:  02/14/2006  Job:  [id number]   cc:   Charlaine Dalton. [name], M.D. [facility]  [address]  [place]  [place] [address]

## 2011-02-25 NOTE — Op Note (Signed)
NAMECAMILLE, THAU NO.:  [id number]   MEDICAL RECORD NO.:  [id number]          PATIENT TYPE:  INP   LOCATION:  1603                         FACILITY:  [facility]   PHYSICIAN:  Velora Heckler, MD      DATE OF BIRTH:  12-28-1945   DATE OF PROCEDURE:  02/11/2006  DATE OF DISCHARGE:                                 OPERATIVE REPORT   PREOPERATIVE DIAGNOSIS:  Acute cholecystitis, cholelithiasis.   POSTOPERATIVE DIAGNOSIS:  Acute cholecystitis, cholelithiasis.   PROCEDURE:  Laparoscopic cholecystectomy with intraoperative  cholangiography.   SURGEON:  Velora Heckler, MD, FACS   ASSISTANT:  Glenna Fellows, MD, FACS   ANESTHESIA:  General per Dr. Helane Rima.   ESTIMATED BLOOD LOSS:  Minimal.   PREPARATION:  Betadine.   COMPLICATIONS:  None.   INDICATIONS:  The patient is a 65 year old white male from [place],  [place] who presents emergency department with abdominal pain.  The  patient had a similar episode 2 weeks ago.  Laboratory studies showed  elevated white count of 13.9.  Ultrasound of the abdomen was obtained and  showed a thickened gallbladder wall with multiple gallstones.  General  surgery was consulted and the patient was prepared for the operating room.   BODY OF REPORT:  Procedure was done in OR #1 at [facility].  The patient is brought to the operating room, placed in supine  position on the operating room table.  Following administration of general  anesthesia, the patient is prepped and draped in usual strict aseptic  fashion.  After ascertaining that an adequate level of anesthesia been  obtained, an infraumbilical incision is made a #15 blade.  Dissection was  carried down through subcutaneous tissues.  Fascia is incised in the  midline.  The peritoneal cavity was entered cautiously.  The 0 Vicryl  pursestring suture is placed in the fascia.  A Hasson cannula was introduced  and secured with a  pursestring suture.  The abdomen was insufflated carbon  dioxide.  Laparoscope was introduced and the abdomen explored.  There was a  tense, distended, edematous gallbladder in the right upper quadrant with  adherent omentum.  Operative ports were placed along the right costal margin  in the midline, midclavicular line, anterior axillary line.  Fundus of the  gallbladder was grasped and retracted cephalad.  Gallbladder wall is quite  friable.  It easily tears and gallbladder drains a clear mucoid liquid  consistent with milk bile.  Dissection at the neck of the gallbladder was  begun.  With some difficulty the neck of the gallbladder was dissected out.  A 1 cm gallstone is extracted from the distal gallbladder.  Cystic artery  was doubly clipped and divided.  Further dissection distally reveals a quite  friable and fragile gallbladder wall.  An attempt was made at  cholangiography but the catheter only refluxes saline upon its installation.  Further dissection revealed she had another gallstone in the distal  gallbladder or proximal cystic duct.  This was also extracted.  Cystic duct  is then dissected out.  The [facility]  cholangiography catheter was again inserted  into the cystic duct and secured with a Ligaclip.  This time real time  cholangiography was performed.  Good representative radiographs were taken.  The common bile duct is normal caliber.  There is free flow of contrast  distally into the duodenum without filling defect or obstruction.  There is  reflux of contrast into the right and left hepatic ductal systems.  A Cook  catheter was withdrawn.  Cystic duct was then triply clipped and divided.  Gallbladder was excised from the gallbladder bed using hook electrocautery  for hemostasis.  Posterior venous tributaries were also divided between  Ligaclips.  The entire gallbladder was extracted as were the prior two  gallstones.  They are placed into an EndoCatch bag and removed through  the  umbilical port from the abdominal cavity.  Right upper quadrant was  copiously irrigated with warm saline which was evacuated.  Good hemostasis  was noted.  0 Vicryl pursestring sutures tied securely at the umbilicus.  Pneumoperitoneum was released and ports were removed under direct vision.  Port sites show good hemostasis.  All ports were removed.  All port sites  were anesthetized with local anesthetic.  All wounds were closed with  interrupted 4-0 Vicryl subcuticular sutures.  Wounds washed and dried.  Benzoin and Steri-Strips were applied.  Sterile dressings were applied.  The  patient is awakened from anesthesia and brought to the recovery room in  stable condition.  The patient tolerated the procedure well.      Velora Heckler, MD  Electronically Signed     TMG/MEDQ  D:  02/11/2006  T:  02/13/2006  Job:  [id number]   cc:   Charlaine Dalton. [name], M.D. [facility]  [address]  [place]  [place] [address]

## 2011-02-25 NOTE — H&P (Signed)
NAME:  Billy [name], Billy [name] NO.:  [id number]   MEDICAL RECORD NO.:  [id number]          PATIENT TYPE:  INP   LOCATION:  0101                         FACILITY:  [facility]   PHYSICIAN:  Velora Heckler, MD      DATE OF BIRTH:  Sep 03, 1946   DATE OF ADMISSION:  02/10/2006  DATE OF DISCHARGE:                                HISTORY & PHYSICAL   REFERRING PHYSICIAN:  Dr. Freddrick March, emergency department.   PRIMARY PHYSICIAN:  Dr. Sandrea Hughs, [facility].   CHIEF COMPLAINT:  Abdominal pain.   HISTORY OF PRESENT ILLNESS:  The patient is 65 year old white male from  [place], [place] who presents with abdominal pain in the right  upper quadrant.  The patient experienced onset of pain approximately 6 p.m.  on May4th.  This was severe.  It was associated with nausea.  He denies  fevers or chills.  The patient had a similar such episode approximately two  weeks ago and had been seen at the Urgent [facility] Center at [facility].  At  that time, he was placed on Levsin.  The patient was seen and evaluated in  the emergency department.  He was noted to have an elevated white blood cell  count of 13,000 with a left shift.  Abdominal ultrasound was performed which  demonstrated a thick-walled gallbladder with multiple gallstones.  General  surgery was consulted for management.   The patient has no prior history of hepatobiliary disease.  He has no  history of jaundice or acholic stools.  He denies fevers or chills.  The  patient does have a significant history of COPD.  He is admitted at this  time for management of acute cholecystitis and cholelithiasis.   PAST MEDICAL HISTORY:  1.  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  2.  History of COPD followed by Dr. Sandrea Hughs.   MEDICATIONS:  Hyoscyamine, albuterol, Advair Diskus.   ALLERGIES:  None known.   SOCIAL HISTORY:  The patient is an out of work Nutritional therapist.  He is  divorced.  He  has two children.  He quit smoking 25 years ago.  He denies  alcohol use.   FAMILY HISTORY:  Noncontributory.  No history of gallbladder disease in  immediate family members.   REVIEW OF SYSTEMS:  15-system review without significant other findings.   EXAM:  In general, a 65 year old white male on a stretcher in the emergency  department mild discomfort.  Temperature 97.3, pulse 75, respirations 16,  blood pressure 107/66.  HEENT shows him to be normocephalic, atraumatic.  Sclerae clear.  Conjunctiva clear.  Pupils equal and reactive.  Dentition  good.  Mucous membranes moist.  Voice normal.  Palpation of the neck shows  no thyroid nodularity.  No lymphadenopathy.  No tenderness.  No mass.  Lungs  are clear to auscultation bilaterally without rales, rhonchi or wheeze.  Cardiac exam shows regular rate and rhythm without murmur.  Peripheral  pulses are full.  Abdomen is soft without distension.  Bowel sounds are  present.  There is a left  flank incision which is well-healed.  Palpation  reveals mild tenderness in the right upper quadrant.  There is no mass.  There is no guarding.  There is no Murphy's sign.  There is no sign of  hernia.  Extremities are nontender without edema.  Neurologically, the  patient is alert and oriented without focal neurologic deficit.   LABORATORY STUDIES:  White count 13.9, hemoglobin 14.0, hematocrit 41.9%,  platelet count 316,000.  Differential shows 91% neutrophils, 6% lymphocytes.  Electrolytes were normal.  Liver function tests were normal.  Amylase is 56,  lipase 22.   Radiographic studies:  Ultrasound of the abdomen shows thick-walled  gallbladder with multiple gallstones.   IMPRESSION:  Acute cholecystitis, cholelithiasis.   PLAN:  1.  Admission to [facility].  2.  Initiation of intravenous antibiotics.  3.  To operating room for laparoscopic cholecystectomy.  4.  Routine postoperative care.   I discussed with the patient  the indications for surgery.  I explained to  him the technique of laparoscopic cholecystectomy.  I explained the  potential for conversion to open surgery.  We discussed his hospital stay.  He understands and wishes to proceed.      Velora Heckler, MD  Electronically Signed     TMG/MEDQ  D:  02/11/2006  T:  02/11/2006  Job:  [id number]   cc:   Charlaine Dalton. [name], M.D. [facility]  [address]  [place]  [place] [address]

## 2011-02-25 NOTE — Assessment & Plan Note (Signed)
[facility]                             PULMONARY OFFICE NOTE   Billy [name], Billy [name]                     MRN:          [id number]  DATE:02/13/2007                            DOB:          [birth year]    HISTORY:  A 65 year old white male, former smoker, with a baseline FEV1  of around 2.8 documented May 2005 and intermittent asthma, typically  occurring during exacerbation of rhinitis.  He also has evidence of  polyps and is supposed to be maintained on nasal steroids in the form of  Nasonex 2 puffs b.i.d. but ran out several weeks ago and comes in today  with increasing dyspnea associated with subjective wheezing and nasal  congestion with no purulent secretions.  He states the only thing that  makes him completely better is prednisone and his last course was 2  months ago.  Since that time, he has noticed only a minimal increased  need for albuterol, but not typically more than twice weekly and denies  any nocturnal exacerbations of wheezing or cough.   PHYSICAL EXAMINATION:  He is a stoic, ambulatory white male in no acute  distress.  He is afebrile with stable vital signs.  HEENT:  Severe turbinate edema with possible polyps bilaterally with  marked edema and minimal cyanosis.  No purulent secretions.  Oropharynx  is clear.  Dentition intact.  NECK:  Supple without cervical adenopathy, tenderness.  Trachea was  midline. No thyromegaly or masses.  LUNG FIELDS:  Perfectly clear bilaterally to auscultation and  percussion.  Regular rhythm without murmur, gallop or rub.  ABDOMEN:  Soft, benign.  EXTREMITIES:  Warm without calf tenderness, cyanosis, clubbing or edema.   IMPRESSION:  1. Most of his problems are rhinitis in nature and certainly this can      lead to asthma exacerbation, but I do not believe he needs any      maintenance therapy for asthma as long as he does not break the      rule of 2's and I reviewed this with him in detail.  2. To reverse his poorly controlled rhinitis, I have recommended a      prednisone Dosepak, but emphasized the importance of using Nasonex      appropriately and gave him samples plus a prescription for refills      and asked him to use Afrin for the first 5 days to help the Nasonex      penetrate to the target tissues and follow up with Dr. [name].  We      actually have had a similar conversation before, during which I      emphasized to him that if he controls rhinitis aggressively and      effectively, he can probably avoid the need for any form of      maintenance inhaler for asthma, but rather just use the albuterol      on a p.r.n. basis.   I spent extra time with this patient, 15-20 minutes going over a chronic  rhinitis flyer, including both text and graphic formatted material,  regarding the nature  of the condition and how to help the medication  reach the target tissue with the use of 5 day Afrin.     Charlaine Dalton. [name], MD, [facility]  Electronically Signed    MBW/MedQ  DD: 02/13/2007  DT: 02/14/2007  Job #: [id number]   cc:   Kristine Garbe. [name], M.D.

## 2011-02-25 NOTE — Discharge Summary (Signed)
. [facility]  Patient:    Billy [name], Billy [name]                     MRN: [id number] Adm. Date:  [id number] Disc. Date: 04/13/00 Attending:  Darnelle Bos CC:         Winn Jock. Earl Gala, M.D.                           Discharge Summary  ADDENDUM   The patient had elevated liver function tests of mild degree.  AST was 62, ALT 76 and Alk phos 130.  This will be followed up as an outpatient.  In addition, we will need to obtain pulmonary function tests as an outpatient as well. DD:  04/13/00 TD:  04/13/00 Job: [id number]

## 2011-02-25 NOTE — Discharge Summary (Signed)
NAME:  Billy [name], Billy [name]                        ACCOUNT NO.:  [id number]   MEDICAL RECORD NO.:  [id number]                   PATIENT TYPE:  INP   LOCATION:  0378                                 FACILITY:  [facility]   PHYSICIAN:  Charlaine Dalton. [name], M.D. [facility]           DATE OF BIRTH:  01-25-1946   DATE OF ADMISSION:  01/27/2003  DATE OF DISCHARGE:  02/01/2003                                 DISCHARGE SUMMARY   FINAL DIAGNOSIS:  1. Chronic obstructive pulmonary disease with refractory asthma felt to be     secondary to acute on chronic sinusitis.  2. Status post left nephrectomy for hydronephrosis age 36.   HISTORY:  This is a 65 year old white male former smoker with COPD with an  asthmatic component that is felt to be triggered by sinusitis. He had been  evaluated by Dr. [name] who recommended surgery but he refused. He says he  overall had been doing well until 4-5 days prior to admission with increased  need for albuterol over baseline secondary to dyspnea associated with a  congested cough but not really bringing up any purulent sputum. He also  noticed nasal congestion.   HOSPITAL COURSE:  He was admitted by Dr. Sung Amabile on April 19 and put on the  usual COPD medications. His differential did not show any significant  eosinophilia. A sinus CT scan showed worsening sinus disease since previous  study dated September 21, 2001.   We therefore treated him aggressively for COPD with an asthmatic component  and also for sinusitis with topical therapy in the form of Flonase and  Afrin. He gradually improved but was still O2 dependent on the 23rd.  The  plan was to wean his oxygen off by the 24th and discharge him on the  following regimen.   DISCHARGE MEDICATIONS:  1. Prednisone 20 mg, 2 b.i.d. for 3 days and then 2 q.a.m. for 3 days and     then 1 q.a.m. until seen.  2. Avelox 400 mg daily for 7 days more of therapy (to complete at least 10     days before reevaluating).  3. Singulair 10  mg q.p.m.  4. Flonase 2 puffs b.i.d.  5. Guiafenesin DM 2 b.i.d.  6. Protonix 40 mg b.i.d. (may substitute Prilosec OTC).  7. Advair 250/50, 1 b.i.d.  8. Combivent or albuterol 2 puffs every 4 hours as needed.  9. Allegra D 1 q. 12 p.r.n. nasal congestion.   Followup in one week.   At the time of discharge, he still has a few wheezes but he is comfortable  with no significant dyspnea with slow ADLs. His chest x-ray shows no  infiltrates.   His condition therefore is felt to be markedly improved. His diet is to be  regular and his activity is to be progressive activity as tolerated.   We will arrange for Dr. [name] to see him in followup as well.  Charlaine Dalton. [name], M.D. [facility]    MBW/MEDQ  D:  01/31/2003  T:  01/31/2003  Job:  [id number]   cc:   Suzanna Obey, M.D.  [address] [place]  [place] [address]  Fax: [phone]

## 2011-02-25 NOTE — Discharge Summary (Signed)
NAME:  Billy, [name]                        ACCOUNT NO.:  [id number]   MEDICAL RECORD NO.:  [id number]                   PATIENT TYPE:  INP   LOCATION:  5705                                 FACILITY:  [facility]   PHYSICIAN:  Casimiro Needle B. [name], M.D. [facility]           DATE OF BIRTH:  1946/02/18   DATE OF ADMISSION:  02/03/2004  DATE OF DISCHARGE:  02/06/2004                                 DISCHARGE SUMMARY   DISCHARGE DIAGNOSIS:  Acute exacerbation of asthma secondary to  pansinusitis.   HISTORY OF PRESENT ILLNESS:  Mr. Billy [name] is a 65 year old white male with  known chronic obstructive pulmonary disease and asthma, who presented to the  emergency department via emergency medical service with a 2-day history of  increasing shortness of breath and chest tightness, as well as restriction  of air flow.  He has noticed wheezing and rattling as well as a dry cough  but denied any purulence.  He has been on Advair in the past but due to the  fact that he felt so good, he quit taking it, and although he has been  utilizing his albuterol puffer p.r.n., he did not correlate the fact that  his pulmonary status had declined.  Despite aggressive treatments with  nebulizers and steroids in the emergency room, he proved refractory to  treatment and was admitted for further evaluation and treatment.   LABORATORY DATA:  Sodium 137, potassium 3.1, chloride 103, CO2 of 33,  glucose 149, BUN is 14, creatinine 1.1.  Calcium is 1.1.  WBC is 14.9,  hemoglobin is 16.1 and hematocrit if 46.4; platelets are 320,000.  PCO2 of  43, PO2 of 114, pH is 7.37, with a bicarb of 25.   RADIOGRAPHIC DATA:  CT of maxillofacial area without contrast shows changes  of pansinusitis.   HOSPITAL COURSE:  PROBLEM #1 - ACUTE EXACERBATION OF CHRONIC OBSTRUCTIVE  PULMONARY DISEASE AND ASTHMATIC BRONCHITIS WITH A PANSINUSITIS TRIGGER:  After being treated with IV steroids, IV antibiotics and nebulized  bronchodilators, he reached  maximal hospital benefit by February 06, 2004.  CT  scan had shown pansinusitis and he was placed on Augmentin 875 mg b.i.d. for  the next 21 days.  An appointment has been made with him to see Dr.  Kristine Garbe. [name] in the near future of the otolaryngology service.  He  will be placed on a steroid taper and will return to his Advair 250/50 and  has been admonished not to stop his medication in the future.  He is being  discharged home in improved condition.   DISCHARGE MEDICATIONS:  1. Advair 250/50 mcg 1 puff b.i.d.  2. Albuterol MDI 1-2 puffs p.r.n. as a rescue medication.  Of note,     extensive time was used to explain to him the power of rescue medication     functions.  3. Augmentin 875 mg b.i.d. until gone for 21 days.  4. Nasal salt  water 2 puffs 3 times a day.  5. Nasonex 2 puffs 2 times a day.  6. Afrin 2 puffs 2 times a day for 3 more days and then stop.  7. He can have either Protonix or Pepcid while on antimicrobial therapy;     again, this was explained to him in great detail.  8. Prednisone a taper -- 40 mg for 3 days, 30 mg for 3 days, 20 mg for 3     days, 10 mg for 3 days and then stop.   DIET:  His diet is no restrictions.   FOLLOWUP:  He had a followup appointment with Dr. [name] as instructed and  Dr. Charlaine Dalton. Wert on Feb 18, 2004 at 10:20 a.m.   DISPOSITION/CONDITION ON DISCHARGE:  Improved.      Billy [name], A.C.N.P. [facility]                 Charlaine Dalton. [name], M.D. [facility]    SM/MEDQ  D:  02/06/2004  T:  02/06/2004  Job:  [id number]   cc:   Kristine Garbe. [name], M.D.  [address]  [place] [address]  Fax: [phone]

## 2011-02-25 NOTE — Assessment & Plan Note (Signed)
Billy [name]                             PULMONARY OFFICE NOTE   Billy [name], Billy [name]                     MRN:          [id number]  DATE:09/07/2006                            DOB:          Mar 14, 1946    PULMONARY/FOLLOWUP OFFICE VISIT   HISTORY:  A 65 year old white male who ran out of medical insurance and  has not been back for 2 years but has been followed previously for COPD  with rhinitis and an asthmatic component that typically is triggered by  rhinitis.  He is apparently working with Dr. [name] to control rhinitis,  which continues to be problematic with intermittent dyspnea and cough  for which he uses p.r.n. albuterol on the average more than twice  weekly.  He typically does not, however, wake up from his sleep and need  any albuterol.  Presently he is on prednisone from Dr. [name] for  treatment of rhinitis.   PHYSICAL EXAMINATION:  GENERAL:  He is a pleasant, ambulatory white male  who does not appear cushingoid.  He is afebrile with normal vital signs.  HEENT:  Remarkable for moderate turbinate edema.  Oropharynx clear.  LUNG FIELDS:  Reveal distant expiratory wheeze bilaterally.  HEART:  There is a regular rhythm without murmur, gallop, or rub.  ABDOMEN:  Soft, benign.  EXTREMITIES:  Warm without calf tenderness, cyanosis, clubbing, or  edema.   IMPRESSION:  Chronic obstructive pulmonary disease with indefinite  asthmatic component that may be triggered by rhinitis, but even on  prednisone is actively wheezing now and needing more than two albuterol  rescue treatments per week.  He would therefore fit the criteria for at  least moderate asthma and recommended therefore reinitiation of Advair  250/50 b.i.d. perfectly regularly, and I reminded him again that  albuterol treats symptoms not the underlying problem.  He needs to work  aggressively to control rhinitis sinusitis through Dr. [name] help,  and then return here in 6 weeks  for PFTs to make sure we have optimized  his lung function and the reversible component of his asthma.   I spent a extra time reviewing these issues with him and also  documenting that he could use DPI with coaching up to 90% effectiveness.     Billy Dalton. [name], MD, [facility]  Electronically Signed    MBW/MedQ  DD: 09/07/2006  DT: 09/08/2006  Job #: [id number]

## 2011-02-25 NOTE — H&P (Signed)
NAME:  Billy [name], Billy [name] NO.:  [id number]   MEDICAL RECORD NO.:  [id number]                   PATIENT TYPE:  EMS   LOCATION:  MAJO                                 FACILITY:  [facility]   PHYSICIAN:  Marcelyn Bruins, M.D. [facility]              DATE OF BIRTH:  [birth year]   DATE OF ADMISSION:  02/03/2004  DATE OF DISCHARGE:                                HISTORY & PHYSICAL   HISTORY OF PRESENT ILLNESS:  The patient is a 65 year old white male, with  known COPD and asthma, who presents to the emergency room tonight with a two  day history of increasing shortness of breath with chest tightness as well  as restriction of air flow.  He has noticed worsening wheezing, and  rattling, as well as a dry cough but denies any purulence.  His nose is  congested as it always is; however, there are no acute sinusitis symptoms.  The patient states that he stopped taking his Advair because I felt so  good.  He has been using p.r.n. albuterol.  He did not call the office to  see if he could have a work-in visit today.  Despite aggressive use of  nebulizers and steroids in the emergency room, he is not better, and  therefore, will need to be admitted for further therapy.   PAST MEDICAL HISTORY:  1. Left nephrectomy, at age 11, secondary to hydronephrosis.  2. History of chronic obstructive pulmonary disease as stated above.   MEDICATIONS:  His usual medications include:  1. Advair of unknown strength one b.i.d.  2. Albuterol p.r.n.   He has no known drug allergies.   SOCIAL HISTORY:  He has a long history of smoking large amounts until 1999  when he quit.   FAMILY HISTORY:  Noncontributory.   REVIEW OF SYSTEMS:  As per history of present illness.   PHYSICAL EXAMINATION:  GENERAL:  He is an obese white male in mild  respiratory distress at this time.  VITAL SIGNS:  Blood pressure 114/75, pulse is 120.  His respiratory rate was  25-30.  He is afebrile.  O2 saturation, on oxygen,  shows a sat of 97-98%.  HEENT:  Pupils equal, round and reactive to light and accommodation.  Extraocular muscles were intact.  Nares patent without discharge.  Oropharynx is clear.  NECK:  Supple without JVD or lymphadenopathy.  There is no palpable  thyromegaly.  CHEST:  Reveals diffuse inspiratory and expiratory wheezes but reasonable  air flow.  There is no pseudo-wheezing.  CARDIAC:  Reveals tachycardia with a regular rhythm.  ABDOMEN:  Soft, nontender with good bowel sounds.  GENITAL:  Not done, not indicated.  RECTAL:  Not done, not indicated.  BREAST:  Not done, not indicated.  EXTREMITIES:  Lower extremities are without edema.  Good pulses distally.  There is no calf tenderness.  NEUROLOGIC:  He is alert and oriented with no obvious motor  deficits.   LABORATORY DATA:  Chest x-ray, labs, and EKG were all at time of dictation.  Arterial blood gas, on oxygen of unknown liter flow, shows a pO2 of 114,  pCO2 44, pH 7.38.   IMPRESSION:  1. Chronic obstructive pulmonary disease/asthma with acute exacerbation     secondary to medical noncompliance.  The patient clearly has acute     bronchospasm at this time and will need to be admitted.  He does not seem     to have an infectious component that has triggered this but rather airway     inflammation brought on by medical noncompliance.  2. Medical noncompliance.   PLAN:  1. We will admit to the regular floor for further nebulizer treatments as     well as steroids.  2. No antibiotics currently.  3. DVT prophylaxis.   The patient before discharge will need asthma education.                                                Marcelyn Bruins, M.D. [facility]    KC/MEDQ  D:  02/03/2004  T:  02/04/2004  Job:  [id number]   cc:   Charlaine Dalton. [name], M.D. [facility]

## 2011-04-05 ENCOUNTER — Observation Stay (HOSPITAL_COMMUNITY)
Admission: EM | Admit: 2011-04-05 | Discharge: 2011-04-05 | Disposition: A | Discharge status: Home / Self Care | Payer: Self-pay | Attending: Emergency Medicine | Admitting: Emergency Medicine

## 2011-04-05 ENCOUNTER — Emergency Department (HOSPITAL_COMMUNITY): Payer: Self-pay

## 2011-04-05 DIAGNOSIS — J449 Chronic obstructive pulmonary disease, unspecified: Secondary | ICD-10-CM | POA: Insufficient documentation

## 2011-04-05 DIAGNOSIS — R0609 Other forms of dyspnea: Secondary | ICD-10-CM | POA: Insufficient documentation

## 2011-04-05 DIAGNOSIS — R0989 Other specified symptoms and signs involving the circulatory and respiratory systems: Secondary | ICD-10-CM | POA: Insufficient documentation

## 2011-04-05 DIAGNOSIS — J4489 Other specified chronic obstructive pulmonary disease: Principal | ICD-10-CM | POA: Insufficient documentation

## 2011-04-05 LAB — BASIC METABOLIC PANEL
BUN: 14 mg/dL (ref 6–23)
CO2: 29 mEq/L (ref 19–32)
Chloride: 102 mEq/L (ref 96–112)
Creatinine, Ser: 0.88 mg/dL (ref 0.50–1.35)
Glucose, Bld: 98 mg/dL (ref 70–99)

## 2011-04-05 LAB — CBC
MCHC: 35.3 g/dL (ref 30.0–36.0)
Platelets: 258 10*3/uL (ref 150–400)
RDW: 12.7 % (ref 11.5–15.5)
WBC: 11.2 10*3/uL — ABNORMAL HIGH (ref 4.0–10.5)

## 2011-04-05 LAB — PROTIME-INR
INR: 0.9 (ref 0.00–1.49)
Prothrombin Time: 12.3 seconds (ref 11.6–15.2)

## 2011-04-05 LAB — TROPONIN I: Troponin I: <0.3 ng/mL (ref <0.30)

## 2011-04-06 ENCOUNTER — Encounter: Payer: Self-pay | Admitting: Internal Medicine

## 2011-04-07 ENCOUNTER — Ambulatory Visit (INDEPENDENT_AMBULATORY_CARE_PROVIDER_SITE_OTHER): Payer: Self-pay | Admitting: Internal Medicine

## 2011-04-07 ENCOUNTER — Encounter: Payer: Self-pay | Admitting: Internal Medicine

## 2011-04-07 DIAGNOSIS — J449 Chronic obstructive pulmonary disease, unspecified: Secondary | ICD-10-CM

## 2011-04-07 DIAGNOSIS — J310 Chronic rhinitis: Secondary | ICD-10-CM

## 2011-04-07 MED ORDER — BUDESONIDE-FORMOTEROL FUMARATE 160-4.5 MCG/ACT IN AERO: INHALATION_SPRAY | RESPIRATORY_TRACT | Status: DC

## 2011-04-07 NOTE — Progress Notes (Signed)
Subjective:     Patient ID: Billy [name], male   DOB: 1946/07/21, 65 y.o.   MRN: [id number]  HPI  52 yowm quit smoking 1990 with cr/ asthma and recurrent flares off meds due to insurance/cost issues   04/07/2011 Initial pulmonary office eval in EMR era p trip to ER 6/26 and placed on prednisone taper plus nebulizer and albuterol but not any maint rx,  Nasal obst symptoms predominate but has yet to see his ENT doc Billy [name]) no purulent sputum.    Sleeping ok without nocturnal  or early am exac of resp c/o's or need for noct saba. Pt denies any significant sore throat, dysphagia, itching, sneezing,    excess/ purulent secretions,  fever, chills, sweats, unintended wt loss, pleuritic or exertional cp, hempoptysis, orthopnea pnd or leg swelling.    Also denies any obvious fluctuation of symptoms with weather or environmental changes or other aggravating or alleviating factors.        Review of Systems  Constitutional: Negative for fever, chills, activity change, appetite change and unexpected weight change.  HENT: Positive for congestion. Negative for sore throat, rhinorrhea, sneezing, trouble swallowing, dental problem, voice change and postnasal drip.   Eyes: Negative for visual disturbance.  Respiratory: Positive for cough and shortness of breath. Negative for choking.   Cardiovascular: Negative for chest pain and leg swelling.  Gastrointestinal: Negative for nausea, vomiting and abdominal pain.  Genitourinary: Negative for difficulty urinating.  Musculoskeletal: Negative for arthralgias.  Skin: Negative for rash.  Psychiatric/Behavioral: Negative for behavioral problems and confusion.       Objective:   Physical Exam    mod depressed amb wm with obvious nasal tone to voice Wt 187 04/07/2011 HEENT severe bilateral turbinate edema with ? obst polyps? Marland Kitchen  Oropharynx no thrush or excess pnd or cobblestoning.  No JVD or cervical adenopathy. Mild accessory muscle hypertrophy. Trachea  midline, nl thryroid. Chest was hyperinflated by percussion with diminished breath sounds and moderate increased exp time with trace late bilateral exp wheeze. Hoover sign positive at mid inspiration. Regular rate and rhythm without murmur gallop or rub or increase P2 or edema.  Abd: no hsm, nl excursion. Ext warm without cyanosis or clubbing.   Assessment:          Plan:

## 2011-04-07 NOTE — Patient Instructions (Signed)
Symbicort 160 Take 2 puffs first thing in am and then another 2 puffs about 12 hours later.  Blow out through the nose  You may need follow up with Dr [name] for your nasal congestion  .Please schedule a follow up office visit in 4 weeks, sooner if needed

## 2011-04-07 NOTE — Assessment & Plan Note (Signed)
DDX of  difficult airways managment all start with A and  include Adherence, Ace Inhibitors, Acid Reflux, Active Sinus Disease, Alpha 1 Antitripsin deficiency, Anxiety masquerading as Airways dz,  ABPA,  allergy(esp in young), Aspiration (esp in elderly), Adverse effects of DPI,  Active smokers, plus two Bs  = Bronchiectasis and Beta blocker use..and one C= CHF  In this case Adherence is the biggest issue and starts with  inability to use HFA effectively and also  understand that SABA treats the symptoms but doesn't get to the underlying problem (inflammation).  I used  the analogy of putting steroid cream on a rash to help explain the meaning of topical therapy and the need to get the drug to the target tissue.    Try symbicort 160 2bid

## 2011-04-07 NOTE — Assessment & Plan Note (Signed)
Referred back to ENT for f/u

## 2011-05-06 ENCOUNTER — Ambulatory Visit: Payer: Self-pay | Admitting: Internal Medicine

## 2012-01-24 ENCOUNTER — Encounter (HOSPITAL_COMMUNITY): Payer: Self-pay | Admitting: Emergency Medicine

## 2012-01-24 ENCOUNTER — Emergency Department (HOSPITAL_COMMUNITY): Payer: Self-pay

## 2012-01-24 ENCOUNTER — Emergency Department (HOSPITAL_COMMUNITY)
Admission: EM | Admit: 2012-01-24 | Discharge: 2012-01-25 | Disposition: A | Discharge status: Home / Self Care | Payer: Self-pay | Attending: Emergency Medicine | Admitting: Emergency Medicine

## 2012-01-24 DIAGNOSIS — R0609 Other forms of dyspnea: Secondary | ICD-10-CM | POA: Insufficient documentation

## 2012-01-24 DIAGNOSIS — R079 Chest pain, unspecified: Secondary | ICD-10-CM | POA: Insufficient documentation

## 2012-01-24 DIAGNOSIS — J4489 Other specified chronic obstructive pulmonary disease: Secondary | ICD-10-CM | POA: Insufficient documentation

## 2012-01-24 DIAGNOSIS — R000 Tachycardia, unspecified: Secondary | ICD-10-CM | POA: Insufficient documentation

## 2012-01-24 DIAGNOSIS — J449 Chronic obstructive pulmonary disease, unspecified: Secondary | ICD-10-CM | POA: Insufficient documentation

## 2012-01-24 DIAGNOSIS — J3489 Other specified disorders of nose and nasal sinuses: Secondary | ICD-10-CM | POA: Insufficient documentation

## 2012-01-24 DIAGNOSIS — R0989 Other specified symptoms and signs involving the circulatory and respiratory systems: Secondary | ICD-10-CM | POA: Insufficient documentation

## 2012-01-24 DIAGNOSIS — R0602 Shortness of breath: Secondary | ICD-10-CM | POA: Insufficient documentation

## 2012-01-24 MED ORDER — ALBUTEROL SULFATE (5 MG/ML) 0.5% IN NEBU
5.0000 mg | INHALATION_SOLUTION | Freq: Once | RESPIRATORY_TRACT | Status: DC
  Filled 2012-01-24: qty 0.5

## 2012-01-24 MED ORDER — ALBUTEROL SULFATE (5 MG/ML) 0.5% IN NEBU
INHALATION_SOLUTION | RESPIRATORY_TRACT | Status: AC
  Filled 2012-01-24: qty 1

## 2012-01-24 MED ORDER — ALBUTEROL SULFATE (5 MG/ML) 0.5% IN NEBU
2.5000 mg | INHALATION_SOLUTION | Freq: Once | RESPIRATORY_TRACT | Status: AC
  Administered 2012-01-24: 2.5 mg via RESPIRATORY_TRACT
  Filled 2012-01-24: qty 0.5

## 2012-01-24 MED ORDER — IPRATROPIUM BROMIDE 0.02 % IN SOLN
0.5000 mg | Freq: Once | RESPIRATORY_TRACT | Status: AC
  Administered 2012-01-24: 0.5 mg via RESPIRATORY_TRACT
  Filled 2012-01-24: qty 2.5

## 2012-01-24 MED ORDER — PREDNISONE 20 MG PO TABS
60.0000 mg | ORAL_TABLET | Freq: Once | ORAL | Status: AC
  Administered 2012-01-24: 60 mg via ORAL
  Filled 2012-01-24: qty 3

## 2012-01-24 NOTE — ED Provider Notes (Signed)
History     CSN: [id number]  Arrival date & time 01/24/12  8469   First MD Initiated Contact with Patient 01/24/12 2213      Chief Complaint  Patient presents with  . Shortness of Breath    (Consider location/radiation/quality/duration/timing/severity/associated sxs/prior treatment) Patient is a 66 y.o. male presenting with shortness of breath. The history is provided by the patient.  Shortness of Breath  Associated symptoms include shortness of breath.  He has severe COPD and noted sudden onset about 5:30 PM of dyspnea and tightness in his chest. He took a nebulizer treatment at home which did not help and he came to the emergency department. He says he feels somewhat better after a breathing treatment in the emergency department but is still short of breath. He relates chronic nasal congestion to the point that isn't unable to breathe through his nose at all and cannot smell or taste things. This is been going on for several years. He has been given a steroid inhaler was given samples but only uses it on an as-needed basis once or twice a month. He denies fever, chills, sweats. He has had a nonproductive cough which is chronic. He denies fever, chills, sweats. Denies nausea or vomiting or arthralgias or myalgias.  Past Medical History  Diagnosis Date  . Intermittent asthma   . Rhinitis   . COPD (chronic obstructive pulmonary disease)     Past Surgical History  Procedure Date  . Cholecystectomy 2009    History reviewed. No pertinent family history.  History  Substance Use Topics  . Smoking status: Former Smoker -- 2.0 packs/day for 15 years    Types: Cigarettes    Quit date: 10/11/1987  . Smokeless tobacco: Never Used  . Alcohol Use: No      Review of Systems  Respiratory: Positive for shortness of breath.   All other systems reviewed and are negative.    Allergies  Review of patient's allergies indicates no known allergies.  Home Medications   Current  Outpatient Rx  Name Route Sig Dispense Refill  . ALBUTEROL SULFATE (2.5 MG/3ML) 0.083% IN NEBU Nebulization Take 2.5 mg by nebulization as needed. For shortness of breath    . BUDESONIDE-FORMOTEROL FUMARATE 160-4.5 MCG/ACT IN AERO Inhalation Inhale 2 puffs into the lungs daily as needed. For shortness of breath      BP 154/86  Pulse 112  Resp 18  SpO2 94%  Physical Exam  Nursing note and vitals reviewed.  66 year old male who is in mild respiratory distress. Vital signs are significant for tachycardia heart rate of 112 and hypertension blood pressure 150 for bradycardia 6. Oxygen saturation was initially 84% which is hypoxic but increased to 94% which is normal. Head is normocephalic and atraumatic. PERRLA, EOMI. Oropharynx is clear. Neck is nontender and supple. Lungs have diminished airflow with inspiratory and expiratory wheezes which are moderate. No rales or rhonchi are heard. Heart has regular rate rhythm without murmur. Abdomen is soft, flat, nontender without masses or hepatosplenomegaly. Extremities have no cyanosis or edema, full range of motion is present. Skin is warm and dry without rash. Her logic: Mental status is normal, cranial nerves are intact, there are no focal motor or sensory deficits.  ED Course  Procedures (including critical care time)  Dg Chest 2 View  01/24/2012  *RADIOLOGY REPORT*  Clinical Data: Worsening shortness of breath for 3 days.  CHEST - 2 VIEW  Comparison: Chest radiograph performed 04/05/2011  Findings: The lungs are well-aerated and clear.  There is no evidence of focal opacification, pleural effusion or pneumothorax. Focal left basilar density is thought to reflect the overlying nipple shadow.  The heart is normal in size; the mediastinal contour is within normal limits.  No acute osseous abnormalities are seen.  Clips are noted within the right upper quadrant, reflecting prior cholecystectomy.  IMPRESSION: No acute cardiopulmonary process seen.  Focal  left basilar density is thought to reflect the overlying nipple shadow.  Would suggest follow-up PA chest radiograph with nipple markers.  Original Report Authenticated By: Tonia Ghent, M.D.   After a dose of prednisone and a second albuterol nebulizer treatment with Atrovent, he is feeling much better and lungs have significantly improved airflow with diminished wheezing. Pulse oximetry will be checked, and if adequate, he will be discharged with a prescription for oral prednisone.  1. COPD       MDM  Exacerbation of COPD. Chest x-ray or be obtained to rule out pneumonia and he will be started on steroids and given additional albuterol nebulizer treatment.        Dione Booze, MD 01/27/12 [phone]

## 2012-01-24 NOTE — ED Notes (Signed)
Pt stated that he has a hx of COPD. Pt stated that he has been SOB for last 3 days. SOB has become increasingly worse. Currently having labored breathing. No CP. No N/V. No cough. Will continue to monitor.

## 2012-01-24 NOTE — ED Notes (Signed)
Dr. Glick at bedside.  

## 2012-01-24 NOTE — ED Notes (Signed)
Pt c/o severe SOB today; pt sts some SOB x several days and has been taking friends breathing treatments with little effect; pt sts hx of similar in past; pt tachypnic with labored breathing and accessory muscle use; pt only able to speak short sentences

## 2012-01-24 NOTE — ED Notes (Signed)
Pt brought to EKG room and placed on 6L of O2 via [place]. Pt reports [place] can not go in nose due to sinus issues, O2 placed in pts mouth per his request, states NRB does not work either. O2 sats increased to 88%.

## 2012-01-24 NOTE — ED Notes (Signed)
Introduced self to pt. Pt sitting on bed. Requested that pt change into a gown and he refused. Asked if we could take his vitals and he refused. He stated that he wanted to leave now, because he has wasted his time here. Per pt, "I believe I can do better at home" Informed the pt of the risk involved with leaving against medical advice, and he stated that he is fully aware and understand. Melvyn Novas EMT is a witness. Dr. Preston Fleeting made aware.

## 2012-01-25 MED ORDER — PREDNISONE 20 MG PO TABS: 60.0000 mg | ORAL_TABLET | Freq: Every day | ORAL | Status: DC

## 2012-01-25 NOTE — Discharge Instructions (Signed)
Continue using your nebulizer treatments at home as needed. Return if symptoms are not responding to your home nebulizer or are getting worse. Make a followup appointment with your pulmonologist in the next 3-5 days. Your pulmonologist will advise you as to whether you need to taper off of the prednisone and will give you a prescription for the taper if it is needed.  Chronic Obstructive Pulmonary Disease Chronic obstructive pulmonary disease (COPD) is a condition in which airflow from the lungs is restricted. The lungs can never return to normal, but there are measures you can take which will improve them and make you feel better. CAUSES   Smoking.   Exposure to secondhand smoke.   Breathing in irritants (pollution, cigarette smoke, strong smells, aerosol sprays, paint fumes).   History of lung infections.  TREATMENT  Treatment focuses on making you comfortable (supportive care). Your caregiver may prescribe medications (inhaled or pills) to help improve your breathing. HOME CARE INSTRUCTIONS   If you smoke, stop smoking.   Avoid exposure to smoke, chemicals, and fumes that aggravate your breathing.   Take antibiotic medicines as directed by your caregiver.   Avoid medicines that dry up your system and slow down the elimination of secretions (antihistamines and cough syrups). This decreases respiratory capacity and may lead to infections.   Drink enough water and fluids to keep your urine clear or pale yellow. This loosens secretions.   Use humidifiers at home and at your bedside if they do not make breathing difficult.   Receive all protective vaccines your caregiver suggests, especially pneumococcal and influenza.   Use home oxygen as suggested.   Stay active. Exercise and physical activity will help maintain your ability to do things you want to do.   Eat a healthy diet.  SEEK MEDICAL CARE IF:   You develop pus-like mucus (sputum).   Breathing is more labored or exercise  becomes difficult to do.   You are running out of the medicine you take for your breathing.  SEEK IMMEDIATE MEDICAL CARE IF:   You have a rapid heart rate.   You have agitation, confusion, tremors, or are in a stupor (family members may need to observe this).   It becomes difficult to breathe.   You develop chest pain.   You have a fever.  MAKE SURE YOU:   Understand these instructions.   Will watch your condition.   Will get help right away if you are not doing well or get worse.  Document Released: 07/06/2005 Document Revised: 09/15/2011 Document Reviewed: 11/26/2010 [facility] Patient Information 2012 [place], [place].  Prednisone tablets What is this medicine? PREDNISONE (PRED ni sone) is a corticosteroid. It is commonly used to treat inflammation of the skin, joints, lungs, and other organs. Common conditions treated include asthma, allergies, and arthritis. It is also used for other conditions, such as blood disorders and diseases of the adrenal glands. This medicine may be used for other purposes; ask your health care provider or pharmacist if you have questions. What should I tell my health care provider before I take this medicine? They need to know if you have any of these conditions: -Cushing's syndrome -diabetes -glaucoma -heart disease -high blood pressure -infection (especially a virus infection such as chickenpox, cold sores, or herpes) -kidney disease -liver disease -mental illness -myasthenia gravis -osteoporosis -seizures -stomach or intestine problems -thyroid disease -an unusual or allergic reaction to lactose, prednisone, other medicines, foods, dyes, or preservatives -pregnant or trying to get pregnant -breast-feeding How should  I use this medicine? Take this medicine by mouth with a glass of water. Follow the directions on the prescription label. Take this medicine with food. If you are taking this medicine once a day, take it in the morning. Do not  take more medicine than you are told to take. Do not suddenly stop taking your medicine because you may develop a severe reaction. Your doctor will tell you how much medicine to take. If your doctor wants you to stop the medicine, the dose may be slowly lowered over time to avoid any side effects. Talk to your pediatrician regarding the use of this medicine in children. Special care may be needed. Overdosage: If you think you have taken too much of this medicine contact a poison control center or emergency room at once. NOTE: This medicine is only for you. Do not share this medicine with others. What if I miss a dose? If you miss a dose, take it as soon as you can. If it is almost time for your next dose, talk to your doctor or health care professional. You may need to miss a dose or take an extra dose. Do not take double or extra doses without advice. What may interact with this medicine? Do not take this medicine with any of the following medications: -metyrapone -mifepristone This medicine may also interact with the following medications: -aminoglutethimide -amphotericin B -aspirin and aspirin-like medicines -barbiturates -certain medicines for diabetes, like glipizide or glyburide -cholestyramine -cholinesterase inhibitors -cyclosporine -digoxin -diuretics -ephedrine -male hormones, like estrogens and birth control pills -isoniazid -ketoconazole -NSAIDS, medicines for pain and inflammation, like ibuprofen or naproxen -phenytoin -rifampin -toxoids -vaccines -warfarin This list may not describe all possible interactions. Give your health care provider a list of all the medicines, herbs, non-prescription drugs, or dietary supplements you use. Also tell them if you smoke, drink alcohol, or use illegal drugs. Some items may interact with your medicine. What should I watch for while using this medicine? Visit your doctor or health care professional for regular checks on your  progress. If you are taking this medicine over a prolonged period, carry an identification card with your name and address, the type and dose of your medicine, and your doctor's name and address. This medicine may increase your risk of getting an infection. Tell your doctor or health care professional if you are around anyone with measles or chickenpox, or if you develop sores or blisters that do not heal properly. If you are going to have surgery, tell your doctor or health care professional that you have taken this medicine within the last twelve months. Ask your doctor or health care professional about your diet. You may need to lower the amount of salt you eat. This medicine may affect blood sugar levels. If you have diabetes, check with your doctor or health care professional before you change your diet or the dose of your diabetic medicine. What side effects may I notice from receiving this medicine? Side effects that you should report to your doctor or health care professional as soon as possible: -allergic reactions like skin rash, itching or hives, swelling of the face, lips, or tongue -changes in emotions or moods -changes in vision -depressed mood -eye pain -fever or chills, cough, sore throat, pain or difficulty passing urine -increased thirst -swelling of ankles, feet Side effects that usually do not require medical attention (report to your doctor or health care professional if they continue or are bothersome): -confusion, excitement, restlessness -headache -nausea, vomiting -skin  problems, acne, thin and shiny skin -trouble sleeping -weight gain This list may not describe all possible side effects. Call your doctor for medical advice about side effects. You may report side effects to FDA at 1-800-FDA-1088. Where should I keep my medicine? Keep out of the reach of children. Store at room temperature between 15 and 30 degrees C (59 and 86 degrees F). Protect from light. Keep  container tightly closed. Throw away any unused medicine after the expiration date. NOTE: This sheet is a summary. It may not cover all possible information. If you have questions about this medicine, talk to your doctor, pharmacist, or health care provider.  2012, [employer]. (05/12/2011 10:57:14 AM)

## 2012-02-01 ENCOUNTER — Ambulatory Visit (INDEPENDENT_AMBULATORY_CARE_PROVIDER_SITE_OTHER)
Admission: RE | Admit: 2012-02-01 | Discharge: 2012-02-01 | Disposition: A | Discharge status: Home / Self Care | Payer: Self-pay | Source: Ambulatory Visit | Attending: Adult Health | Admitting: Adult Health

## 2012-02-01 ENCOUNTER — Ambulatory Visit (INDEPENDENT_AMBULATORY_CARE_PROVIDER_SITE_OTHER): Payer: Self-pay | Admitting: Adult Health

## 2012-02-01 ENCOUNTER — Encounter: Payer: Self-pay | Admitting: Adult Health

## 2012-02-01 VITALS — BP 136/72 | HR 101 | Temp 97.0°F | Ht 69.0 in | Wt 196.6 lb

## 2012-02-01 DIAGNOSIS — J449 Chronic obstructive pulmonary disease, unspecified: Secondary | ICD-10-CM

## 2012-02-01 MED ORDER — PREDNISONE 10 MG PO TABS: ORAL_TABLET | ORAL | Status: DC

## 2012-02-01 MED ORDER — AMOXICILLIN 500 MG PO CAPS: 500.0000 mg | ORAL_CAPSULE | Freq: Three times a day (TID) | ORAL | Status: AC

## 2012-02-01 MED ORDER — BUDESONIDE-FORMOTEROL FUMARATE 160-4.5 MCG/ACT IN AERO: 2.0000 | INHALATION_SPRAY | Freq: Two times a day (BID) | RESPIRATORY_TRACT | Status: DC

## 2012-02-01 NOTE — Patient Instructions (Addendum)
Begin Symbicort 160/4.95mcg 2 puffs Twice daily  -brush/rinse and gargle  Nasonex 2 puffs Twice daily  - until sample is gone.  Saline nasal rinses As needed   Amoxicillin 500mg  Three times a day  For 10 days  Prednisone taper as directed.  I will call with xray results.  follow up [name]  In 2 months with PFT and As needed   Please contact office for sooner follow up if symptoms do not improve or worsen or seek emergency care

## 2012-02-01 NOTE — Progress Notes (Signed)
Subjective:     Patient ID: RAED SCHALK, male   DOB: [birth year], 66 y.o.   MRN: [id number]  HPI  22 yowm quit smoking 1990 with cr/ asthma and recurrent flares off meds due to insurance/cost issues  04/07/2011 Initial pulmonary office eval in EMR era p trip to ER 6/26 and placed on prednisone taper plus nebulizer and albuterol but not any maint rx,  Nasal obst symptoms predominate but has yet to see his ENT doc [name]) no purulent sputum.   >>symbicort rx   02/01/2012 Acute OV  Complains of increased SOB, tightness/pressure in chest for 2 weeks. Went to ED 4.16.13 and was given pred 60mg  and neb tx.  CXR showed NAD , was recommended to repeat for nipple markers re -left density ? Nipple shadow.  He does not have insurance or rx coverage.  Was seen ~1 year ago, dx with possible asthma , rx Symbicort.  He is self employed . No insurance will be on medicare in June this year.  Has a lot of nasal congestion , stuffiness and congestion . After took prednisone he felt much better , nasal stuffiness was much better. Feels great now . Breathing is much better. Nasal stuffiness is better.  FEV1 1.43 L , 41%, 45 ratio     Review of Systems  Constitutional:   No  weight loss, night sweats,  Fevers, chills, fatigue, or  lassitude.  HEENT:   No headaches,  Difficulty swallowing,  Tooth/dental problems, or  Sore throat,                No sneezing, itching, ear ache,  +nasal congestion, post nasal drip,   CV:  No chest pain,  Orthopnea, PND, swelling in lower extremities, anasarca, dizziness, palpitations, syncope.   GI  No heartburn, indigestion, abdominal pain, nausea, vomiting, diarrhea, change in bowel habits, loss of appetite, bloody stools.   Resp:   No coughing up of blood.     No chest wall deformity  Skin: no rash or lesions.  GU: no dysuria, change in color of urine, no urgency or frequency.  No flank pain, no hematuria   MS:  No joint pain or swelling.  No decreased range of  motion.  No back pain.  Psych:  No change in mood or affect. No depression or anxiety.  No memory loss.         Objective:   Physical Exam    Wt 187 04/07/2011>>02/01/2012 196  HEENT severe bilateral turbinate edema with ? obst polyps? Marland Kitchen  Oropharynx no thrush or excess pnd or cobblestoning.  No JVD or cervical adenopathy. Mild accessory muscle hypertrophy. Trachea midline, nl thryroid. Chest >coarse BS w/ faint exp wheeze  . Regular rate and rhythm without murmur gallop or rub or increase P2 or edema.  Abd: no hsm, nl excursion. Ext warm without cyanosis or clubbing.   Assessment:          Plan:

## 2012-02-01 NOTE — Assessment & Plan Note (Signed)
Flare with associated sinusitis   Plan:  Begin Symbicort 160/4.69mcg 2 puffs Twice daily  -brush/rinse and gargle  Nasonex 2 puffs Twice daily  - until sample is gone.  Saline nasal rinses As needed   Amoxicillin 500mg  Three times a day  For 10 days  I will call with xray results.  Follow up [name]  In 2 months with PFT and As needed   Please contact office for sooner follow up if symptoms do not improve or worsen or seek emergency care

## 2012-04-02 ENCOUNTER — Encounter: Payer: Self-pay | Admitting: Internal Medicine

## 2012-04-02 ENCOUNTER — Ambulatory Visit (INDEPENDENT_AMBULATORY_CARE_PROVIDER_SITE_OTHER): Payer: Medicare Other | Admitting: Internal Medicine

## 2012-04-02 VITALS — BP 120/82 | HR 85 | Temp 97.6°F | Ht 69.0 in | Wt 202.2 lb

## 2012-04-02 DIAGNOSIS — J4489 Other specified chronic obstructive pulmonary disease: Secondary | ICD-10-CM

## 2012-04-02 DIAGNOSIS — J339 Nasal polyp, unspecified: Secondary | ICD-10-CM

## 2012-04-02 DIAGNOSIS — J449 Chronic obstructive pulmonary disease, unspecified: Secondary | ICD-10-CM

## 2012-04-02 MED ORDER — AMOXICILLIN-POT CLAVULANATE 875-125 MG PO TABS: 1.0000 | ORAL_TABLET | Freq: Two times a day (BID) | ORAL | Status: AC

## 2012-04-02 MED ORDER — PREDNISONE (PAK) 10 MG PO TABS: ORAL_TABLET | ORAL | Status: AC

## 2012-04-02 NOTE — Progress Notes (Signed)
Subjective:     Patient ID: Billy [name], male   DOB: [birth year] .   MRN: [id number]  HPI  68 yowm quit smoking 1990 with cr/ asthma and recurrent flares off meds due to insurance/cost issues  04/07/2011 Initial pulmonary office eval in EMR era p trip to ER 6/26 and placed on prednisone taper plus nebulizer and albuterol but not any maint rx,  Nasal obst symptoms predominate but has yet to see his ENT doc [name]) no purulent sputum.   >>symbicort rx   02/01/2012 Acute OV  Complains of increased SOB, tightness/pressure in chest for 2 weeks. Went to ED 4.16.13 and was given pred 60mg  and neb tx.  CXR showed NAD , was recommended to repeat for nipple markers re -left density ? Nipple shadow.  He does not have insurance or rx coverage.  Was seen ~1 year ago, dx with possible asthma , rx Symbicort.  He is self employed . No insurance will be on medicare in June this year.  Has a lot of nasal congestion , stuffiness and congestion . After took prednisone he felt much better , nasal stuffiness was much better. Feels great now . Breathing is much better. Nasal stuffiness is better.  FEV1 1.43 L , 41%, 45 ratio  rec Begin Symbicort 160/4.80mcg 2 puffs Twice daily  -brush/rinse and gargle  Nasonex 2 puffs Twice daily  - until sample is gone.  Saline nasal rinses As needed   Amoxicillin 500mg  Three times a day  For 10 days  Prednisone taper as directed.  I will call with xray results.  follow up [name]  In 2 months with PFT and As needed   04/02/2012 f/u ov/Belisa Eichholz cc much better p above rx, then gradually worse overall w/in 1 week of completing rx with mostly c/o nasal congestion and intermittently purulent nasal secretions. Breathing ok on symbicort. No variability daytime.  Sleeping ok without nocturnal  or early am exacerbation  of respiratory  c/o's or need for noct saba. Also denies any obvious fluctuation of symptoms with weather or environmental changes or other aggravating or alleviating  factors except as outlined above   ROS  At present neg for  any significant sore throat, dysphagia, dental problems ear ache,   fever, chills, sweats, unintended wt loss, pleuritic or exertional cp, hemoptysis, palpitations, orthopnea pnd or leg swelling.  Also denies presyncope, palpitations, heartburn, abdominal pain, anorexia, nausea, vomiting, diarrhea  or change in bowel or urinary habits, change in stools or urine, dysuria,hematuria,  rash, arthralgias, visual complaints, headache, numbness weakness or ataxia or problems with walking or coordination. No noted change in mood/affect or memory.                    Objective:   Physical Exam    Wt 187 04/07/2011>>02/01/2012 196 > 04/02/2012  202  HEENT severe bilateral turbinate edema with ? obst polyps? Severe L >R (vs mass) .  Oropharynx no thrush or excess pnd or cobblestoning.  No JVD or cervical adenopathy. Mild accessory muscle hypertrophy. Trachea midline, nl thryroid. Chest >slt distant BS w/ faint exp wheeze   Regular rate and rhythm without murmur gallop or rub or increase P2 or edema.  Abd: no hsm, nl excursion. Ext warm without cyanosis or clubbing.   Assessment:          Plan:

## 2012-04-02 NOTE — Assessment & Plan Note (Addendum)
-   PFT's 03/16/2007 FEV1  2.29 (70%) ratio 57 with 10% resp to B2 and DLCO 86     - HFA 75% p coaching  04/07/2011   GOLD II, Adequate control on present rx, reviewed : no change Rx

## 2012-04-02 NOTE — Assessment & Plan Note (Signed)
-   Sinus CT ordered 04/02/2012 >>  Will treat acutely with pred/augmentin but definitely needs ent eval asap based on severity and likely affect on control of lower airways.

## 2012-04-02 NOTE — Patient Instructions (Signed)
Prednsione 10 mg Take 4 for three days 3 for three days 2 for three days 1 for three days and stop   Augmentin twice daily x 10 days  Please see patient coordinator before you leave today  to schedule sinus CT  Contact [name] office for follow up as soon as possible - call us if needed   Please schedule a follow up visit in 3 months but call sooner if needed with pft's

## 2012-04-03 ENCOUNTER — Ambulatory Visit (INDEPENDENT_AMBULATORY_CARE_PROVIDER_SITE_OTHER)
Admission: RE | Admit: 2012-04-03 | Discharge: 2012-04-03 | Disposition: A | Discharge status: Home / Self Care | Payer: Medicare Other | Source: Ambulatory Visit | Attending: Internal Medicine | Admitting: Internal Medicine

## 2012-04-03 DIAGNOSIS — J339 Nasal polyp, unspecified: Secondary | ICD-10-CM

## 2012-07-04 ENCOUNTER — Ambulatory Visit (INDEPENDENT_AMBULATORY_CARE_PROVIDER_SITE_OTHER): Payer: Medicare Other | Admitting: Internal Medicine

## 2012-07-04 ENCOUNTER — Encounter: Payer: Self-pay | Admitting: Internal Medicine

## 2012-07-04 VITALS — BP 122/74 | HR 84 | Temp 97.6°F | Ht 70.0 in | Wt 202.0 lb

## 2012-07-04 DIAGNOSIS — J449 Chronic obstructive pulmonary disease, unspecified: Secondary | ICD-10-CM

## 2012-07-04 LAB — PULMONARY FUNCTION TEST

## 2012-07-04 MED ORDER — BUDESONIDE-FORMOTEROL FUMARATE 160-4.5 MCG/ACT IN AERO: 2.0000 | INHALATION_SPRAY | Freq: Two times a day (BID) | RESPIRATORY_TRACT | Status: DC

## 2012-07-04 NOTE — Progress Notes (Signed)
Subjective:     Patient ID: Billy [name], male   DOB: [birth year] .   MRN: [id number]  HPI  49 yowm quit smoking 1990 with cr/ asthma and recurrent flares off meds due to insurance/cost issues  04/07/2011 Initial pulmonary office eval in EMR era p trip to ER 6/26 and placed on prednisone taper plus nebulizer and albuterol but not any maint rx,  Nasal obst symptoms predominate but has yet to see his ENT doc [name]) no purulent sputum.   >>symbicort rx   02/01/2012 Acute OV  Complains of increased SOB, tightness/pressure in chest for 2 weeks. Went to ED 4.16.13 and was given pred 60mg  and neb tx.  CXR showed NAD , was recommended to repeat for nipple markers re -left density ? Nipple shadow.  He does not have insurance or rx coverage.  Was seen ~1 year ago, dx with possible asthma , rx Symbicort.  He is self employed . No insurance will be on medicare in June this year.  Has a lot of nasal congestion , stuffiness and congestion . After took prednisone he felt much better , nasal stuffiness was much better. Feels great now . Breathing is much better. Nasal stuffiness is better.  FEV1 1.43 L , 41%, 45 ratio  rec Begin Symbicort 160/4.38mcg 2 puffs Twice daily  -brush/rinse and gargle  Nasonex 2 puffs Twice daily  - until sample is gone.  Saline nasal rinses As needed   Amoxicillin 500mg  Three times a day  For 10 days  Prednisone taper as directed.  I will call with xray results.  follow up [name]  In 2 months with PFT and As needed   04/02/2012 f/u ov/Wert cc much better p above rx, then gradually worse overall w/in 1 week of completing rx with mostly c/o nasal congestion and intermittently purulent nasal secretions. Breathing ok on symbicort. No variability daytime. rec Prednsione 10 mg Take 4 for three days 3 for three days 2 for three days 1 for three days and stop   Augmentin twice daily x 10 days  Please see patient coordinator before you leave today  to schedule sinus CT  Contact  [name] office for follow up as soon as possible - call us if needed   Please schedule a follow up visit in 3 months but call sooner if needed with pft's    07/04/2012 f/u ov/Wert cc doing ok but not taking symbicort as instructed, using saba sev times a day, No obvious daytime variabilty or assoc chronic cough or cp or chest tightness, subjective wheeze overt sinus or hb symptoms. No unusual exp hx or h/o childhood pna/ asthma or premature birth to his knowledge.   Sleeping ok without nocturnal  or early am exacerbation  of respiratory  c/o's or need for noct saba. Also denies any obvious fluctuation of symptoms with weather or environmental changes or other aggravating or alleviating factors except as outlined above   ROS  At present neg for  any significant sore throat, dysphagia, dental problems ear ache,   fever, chills, sweats, unintended wt loss, pleuritic or exertional cp, hemoptysis, palpitations, orthopnea pnd or leg swelling.  Also denies presyncope, palpitations, heartburn, abdominal pain, anorexia, nausea, vomiting, diarrhea  or change in bowel or urinary habits, change in stools or urine, dysuria,hematuria,  rash, arthralgias, visual complaints, headache, numbness weakness or ataxia or problems with walking or coordination. No noted change in mood/affect or memory.  Objective:   Physical Exam    Wt 187 04/07/2011>>02/01/2012 196 > 04/02/2012  202 > 07/04/2012  202 HEENT severe bilateral turbinate edema with ? obst polyps? Severe L >R (vs mass) .  Oropharynx no thrush or excess pnd or cobblestoning.  No JVD or cervical adenopathy. Mild accessory muscle hypertrophy. Trachea midline, nl thryroid. Chest >slt distant BS w/ faint exp wheeze   Regular rate and rhythm without murmur gallop or rub or increase P2 or edema.  Abd: no hsm, nl excursion. Ext warm without cyanosis or clubbing.   Assessment:          Plan:

## 2012-07-04 NOTE — Progress Notes (Signed)
PFT done today. 

## 2012-07-04 NOTE — Patient Instructions (Addendum)
Plan A Symbicort 160 Take 2 puffs first thing in am and then another 2 puffs about 12 hours later.    Only use your albuterol (Plan B is ventolin, Plan C is Nebulizer) as a rescue medication to be used if you can't catch your breath by resting or doing a relaxed purse lip breathing pattern. The less you use it, the better it will work when you need it.   Please schedule a follow up visit in 3 months but call sooner if needed

## 2012-07-06 NOTE — Assessment & Plan Note (Signed)
-   PFT's 03/16/2007 FEV1  2.29 (70%) ratio 57 with 10% resp to B2 and DLCO 86      -PFT's 07/04/2012 FEV1 1.02 (33%) 1.61 p B2 (58%) and DLCO 76%     - HFA 75% p coaching  07/04/12  Even p 58% improvement from B2 he is no where near the previous fev1 so technically has copd but predominantly he has severe chronic asthma with unaddressed inflammatory component and poorly controlled rhinitis/sinusitis   I had an extended discussion with the patient today lasting 15 to 20 minutes of a 25 minute visit on the following issues:   He needs to understand how to use symbicort more consistently/ effectively. See instructions for specific recommendations which were reviewed directly with the patient who was given a copy with highlighter outlining the key components.  The proper method of use, as well as anticipated side effects, of a metered-dose inhaler are discussed and demonstrated to the patient. Improved effectiveness after extensive coaching during this visit to a level of approximately  75%

## 2012-07-19 ENCOUNTER — Encounter: Payer: Self-pay | Admitting: Internal Medicine

## 2012-11-24 ENCOUNTER — Emergency Department (HOSPITAL_COMMUNITY): Payer: Medicare Other

## 2012-11-24 ENCOUNTER — Encounter (HOSPITAL_COMMUNITY): Payer: Self-pay | Admitting: *Deleted

## 2012-11-24 ENCOUNTER — Inpatient Hospital Stay (HOSPITAL_COMMUNITY)
Admission: EM | Admit: 2012-11-24 | Discharge: 2012-11-26 | DRG: 194 | Disposition: A | Discharge status: Home / Self Care | Payer: Medicare Other | Attending: Internal Medicine | Admitting: Internal Medicine

## 2012-11-24 DIAGNOSIS — J111 Influenza due to unidentified influenza virus with other respiratory manifestations: Secondary | ICD-10-CM | POA: Diagnosis present

## 2012-11-24 DIAGNOSIS — R609 Edema, unspecified: Secondary | ICD-10-CM | POA: Diagnosis present

## 2012-11-24 DIAGNOSIS — Z79899 Other long term (current) drug therapy: Secondary | ICD-10-CM

## 2012-11-24 DIAGNOSIS — R0902 Hypoxemia: Secondary | ICD-10-CM | POA: Diagnosis present

## 2012-11-24 DIAGNOSIS — Z23 Encounter for immunization: Secondary | ICD-10-CM

## 2012-11-24 DIAGNOSIS — J441 Chronic obstructive pulmonary disease with (acute) exacerbation: Secondary | ICD-10-CM | POA: Diagnosis present

## 2012-11-24 DIAGNOSIS — J4550 Severe persistent asthma, uncomplicated: Secondary | ICD-10-CM | POA: Diagnosis present

## 2012-11-24 DIAGNOSIS — Z87891 Personal history of nicotine dependence: Secondary | ICD-10-CM

## 2012-11-24 DIAGNOSIS — J45901 Unspecified asthma with (acute) exacerbation: Secondary | ICD-10-CM | POA: Diagnosis present

## 2012-11-24 DIAGNOSIS — J101 Influenza due to other identified influenza virus with other respiratory manifestations: Principal | ICD-10-CM | POA: Diagnosis present

## 2012-11-24 LAB — COMPREHENSIVE METABOLIC PANEL
AST: 33 U/L (ref 0–37)
Albumin: 4.2 g/dL (ref 3.5–5.2)
Calcium: 10 mg/dL (ref 8.4–10.5)
Creatinine, Ser: 1.03 mg/dL (ref 0.50–1.35)
Total Protein: 7.9 g/dL (ref 6.0–8.3)

## 2012-11-24 LAB — CBC WITH DIFFERENTIAL/PLATELET
Basophils Absolute: 0 10*3/uL (ref 0.0–0.1)
Basophils Relative: 0 % (ref 0–1)
Eosinophils Absolute: 0.1 10*3/uL (ref 0.0–0.7)
Eosinophils Relative: 1 % (ref 0–5)
HCT: 46.9 % (ref 39.0–52.0)
MCHC: 34.3 g/dL (ref 30.0–36.0)
MCV: 87.5 fL (ref 78.0–100.0)
Monocytes Absolute: 0.4 10*3/uL (ref 0.1–1.0)
Neutro Abs: 9.3 10*3/uL — ABNORMAL HIGH (ref 1.7–7.7)
RDW: 13.2 % (ref 11.5–15.5)

## 2012-11-24 LAB — INFLUENZA PANEL BY PCR (TYPE A & B)
Influenza A By PCR: POSITIVE — AB
Influenza B By PCR: NEGATIVE

## 2012-11-24 MED ORDER — LEVOFLOXACIN IN D5W 500 MG/100ML IV SOLN
500.0000 mg | Freq: Once | INTRAVENOUS | Status: DC
  Filled 2012-11-24: qty 100

## 2012-11-24 MED ORDER — ONDANSETRON HCL 4 MG PO TABS: 4.0000 mg | ORAL_TABLET | Freq: Four times a day (QID) | ORAL | Status: DC | PRN

## 2012-11-24 MED ORDER — PREDNISONE 20 MG PO TABS
40.0000 mg | ORAL_TABLET | Freq: Every day | ORAL | Status: DC
  Administered 2012-11-25 – 2012-11-26 (×2): 40 mg via ORAL
  Filled 2012-11-24 (×3): qty 2

## 2012-11-24 MED ORDER — PNEUMOCOCCAL VAC POLYVALENT 25 MCG/0.5ML IJ INJ
0.5000 mL | INJECTION | INTRAMUSCULAR | Status: AC
  Administered 2012-11-25: 0.5 mL via INTRAMUSCULAR
  Filled 2012-11-24: qty 0.5

## 2012-11-24 MED ORDER — ACETAMINOPHEN 325 MG PO TABS: 650.0000 mg | ORAL_TABLET | Freq: Four times a day (QID) | ORAL | Status: DC | PRN

## 2012-11-24 MED ORDER — ACETAMINOPHEN 650 MG RE SUPP: 650.0000 mg | Freq: Four times a day (QID) | RECTAL | Status: DC | PRN

## 2012-11-24 MED ORDER — BUDESONIDE-FORMOTEROL FUMARATE 160-4.5 MCG/ACT IN AERO
2.0000 | INHALATION_SPRAY | Freq: Two times a day (BID) | RESPIRATORY_TRACT | Status: DC
  Administered 2012-11-24 – 2012-11-26 (×4): 2 via RESPIRATORY_TRACT
  Filled 2012-11-24: qty 6

## 2012-11-24 MED ORDER — INFLUENZA VIRUS VACC SPLIT PF IM SUSP
0.5000 mL | INTRAMUSCULAR | Status: AC
  Administered 2012-11-25: 0.5 mL via INTRAMUSCULAR
  Filled 2012-11-24: qty 0.5

## 2012-11-24 MED ORDER — IPRATROPIUM BROMIDE 0.02 % IN SOLN
0.5000 mg | RESPIRATORY_TRACT | Status: DC
  Administered 2012-11-24 – 2012-11-25 (×8): 0.5 mg via RESPIRATORY_TRACT
  Filled 2012-11-24 (×8): qty 2.5

## 2012-11-24 MED ORDER — METHYLPREDNISOLONE SODIUM SUCC 125 MG IJ SOLR
125.0000 mg | Freq: Once | INTRAMUSCULAR | Status: AC
  Administered 2012-11-24: 125 mg via INTRAVENOUS
  Filled 2012-11-24: qty 2

## 2012-11-24 MED ORDER — SODIUM CHLORIDE 0.9 % IV SOLN
INTRAVENOUS | Status: AC
  Administered 2012-11-24: 18:00:00 via INTRAVENOUS
  Administered 2012-11-25: 1000 mL via INTRAVENOUS

## 2012-11-24 MED ORDER — SODIUM CHLORIDE 0.9 % IV BOLUS (SEPSIS): 500.0000 mL | Freq: Once | INTRAVENOUS | Status: DC

## 2012-11-24 MED ORDER — ENOXAPARIN SODIUM 40 MG/0.4ML ~~LOC~~ SOLN
40.0000 mg | SUBCUTANEOUS | Status: DC
  Administered 2012-11-24 – 2012-11-25 (×2): 40 mg via SUBCUTANEOUS
  Filled 2012-11-24 (×3): qty 0.4

## 2012-11-24 MED ORDER — DOXYCYCLINE HYCLATE 100 MG PO TABS
100.0000 mg | ORAL_TABLET | Freq: Two times a day (BID) | ORAL | Status: DC
  Administered 2012-11-24 – 2012-11-26 (×5): 100 mg via ORAL
  Filled 2012-11-24 (×6): qty 1

## 2012-11-24 MED ORDER — ONDANSETRON HCL 4 MG/2ML IJ SOLN: 4.0000 mg | Freq: Four times a day (QID) | INTRAMUSCULAR | Status: DC | PRN

## 2012-11-24 MED ORDER — LEVALBUTEROL HCL 0.63 MG/3ML IN NEBU
0.6300 mg | INHALATION_SOLUTION | RESPIRATORY_TRACT | Status: DC
  Administered 2012-11-24 – 2012-11-25 (×8): 0.63 mg via RESPIRATORY_TRACT
  Filled 2012-11-24 (×11): qty 3

## 2012-11-24 MED ORDER — ALBUTEROL (5 MG/ML) CONTINUOUS INHALATION SOLN
10.0000 mg/h | INHALATION_SOLUTION | RESPIRATORY_TRACT | Status: DC
  Administered 2012-11-24: 10 mg/h via RESPIRATORY_TRACT
  Filled 2012-11-24: qty 20

## 2012-11-24 MED ORDER — OSELTAMIVIR PHOSPHATE 75 MG PO CAPS
75.0000 mg | ORAL_CAPSULE | Freq: Two times a day (BID) | ORAL | Status: DC
  Administered 2012-11-24 – 2012-11-26 (×4): 75 mg via ORAL
  Filled 2012-11-24 (×5): qty 1

## 2012-11-24 MED ORDER — IPRATROPIUM BROMIDE 0.02 % IN SOLN
0.5000 mg | Freq: Once | RESPIRATORY_TRACT | Status: AC
  Administered 2012-11-24: 0.5 mg via RESPIRATORY_TRACT
  Filled 2012-11-24: qty 2.5

## 2012-11-24 NOTE — ED Provider Notes (Signed)
History  This chart was scribed for Loren Racer, MD by Bennett Scrape, ED Scribe. This patient was seen in room A07C/A07C and the patient's care was started at 9:24 AM.  CSN: [id number]  Arrival date & time 11/24/12  0904   First MD Initiated Contact with Patient 11/24/12 [phone]      Chief Complaint  Patient presents with  . Cough  . Shortness of Breath     Patient is a 67 y.o. male presenting with shortness of breath. The history is provided by the patient. No language interpreter was used.  Shortness of Breath Severity:  Moderate Onset quality:  Gradual Duration:  3 days Timing:  Constant Progression:  Worsening Chronicity:  Chronic Context: URI   Relieved by:  Sitting up Worsened by:  Coughing Ineffective treatments:  Inhaler Associated symptoms: cough and fever   Associated symptoms: no chest pain, no sputum production and no vomiting     Billy [name] is a 67 y.o. male with a h/o COPD who presents to the Emergency Department complaining of 2 days of gradually increasing chronic SOB with associated non-productive cough and fevers that attributes to COPD excerebration. He states that he has been using his inhalers at home with mild improvement. He reports prior admissions for COPD but denies prior intubations or use of bipap. He denies leg swelling or pain, nausea and emesis as associated symptoms. He is a former smoker but denies alcohol use.  Past Medical History  Diagnosis Date  . Intermittent asthma   . Rhinitis   . COPD (chronic obstructive pulmonary disease)     Past Surgical History  Procedure Laterality Date  . Cholecystectomy  2009    History reviewed. No pertinent family history.  History  Substance Use Topics  . Smoking status: Former Smoker -- 2.00 packs/day for 15 years    Types: Cigarettes    Quit date: 10/11/1987  . Smokeless tobacco: Never Used  . Alcohol Use: No      Review of Systems  Constitutional: Positive for fever. Negative  for chills.  Respiratory: Positive for cough and shortness of breath. Negative for sputum production and chest tightness.   Cardiovascular: Negative for chest pain and leg swelling.  Gastrointestinal: Negative for nausea and vomiting.  All other systems reviewed and are negative.    Allergies  Review of patient's allergies indicates no known allergies.  Home Medications   Current Outpatient Rx  Name  Route  Sig  Dispense  Refill  . albuterol (PROVENTIL) (2.5 MG/3ML) 0.083% nebulizer solution   Nebulization   Take 2.5 mg by nebulization as needed. For shortness of breath         . budesonide-formoterol (SYMBICORT) 160-4.5 MCG/ACT inhaler   Inhalation   Inhale 2 puffs into the lungs 2 (two) times daily. For shortness of breath   3 Inhaler   3     Triage Vitals: BP 116/63  Pulse 120  Temp(Src) 98.8 F (37.1 C) (Oral)  Resp 26  SpO2 81%  Physical Exam  Nursing note and vitals reviewed. Constitutional: He is oriented to person, place, and time. He appears well-developed and well-nourished. No distress.  HENT:  Head: Normocephalic and atraumatic.  Eyes: Conjunctivae and EOM are normal.  Neck: Neck supple. No tracheal deviation present.  Cardiovascular: Normal rate and regular rhythm.   Pulmonary/Chest: Effort normal. No respiratory distress. He has wheezes (diffuse inspiratory and expiratory wheezing).  Pt is speaking in full sentences  Abdominal: Soft. There is no tenderness.  Musculoskeletal:  Normal range of motion. He exhibits no edema.  Neurological: He is alert and oriented to person, place, and time.  Skin: Skin is warm and dry.  Psychiatric: He has a normal mood and affect. His behavior is normal.    ED Course  Procedures (including critical care time)  DIAGNOSTIC STUDIES: Oxygen Saturation is 97% on NRB, adequate by my interpretation.    COORDINATION OF CARE: 9:45 AM-Discussed treatment plan which includes breathing treatment, CXR, CBC panel, UA with pt  at bedside and pt agreed to plan.   10:00 AM- Ordered 125 mg Solumedrol, 500 mL of bolus, continuous albuterol neb solution and 0.5 mg Atrovent solution  11:21 AM-Consult complete with Internal Medicine. Patient case explained and discussed. Internal Medicine advises to admit patient to teaching services for further evaluation and treatment. Call ended at 11:21 AM.  11:30 AM-Ordered 500 mg Levaquin IVPB  Labs Reviewed  CBC WITH DIFFERENTIAL - Abnormal; Notable for the following:    Neutrophils Relative 91 (*)    Neutro Abs 9.3 (*)    Lymphocytes Relative 4 (*)    Lymphs Abs 0.4 (*)    All other components within normal limits  COMPREHENSIVE METABOLIC PANEL - Abnormal; Notable for the following:    Sodium 134 (*)    Chloride 94 (*)    Glucose, Bld 110 (*)    GFR calc non Af Amer 74 (*)    GFR calc Af Amer 86 (*)    All other components within normal limits  TROPONIN I  INFLUENZA PANEL BY PCR   Dg Chest Port 1 View  11/24/2012  *RADIOLOGY REPORT*  Clinical Data: Shortness of breath, COPD.  PORTABLE CHEST - 1 VIEW  Comparison: 02/01/2012  Findings: Hyperinflation of the lungs.  Heart is normal size.  No confluent airspace opacities or effusions.  No acute bony abnormality.  IMPRESSION: COPD.  No acute findings.   Original Report Authenticated By: Charlett Nose, M.D.      1. COPD exacerbation      Date: 11/24/2012  Rate: 119  Rhythm: sinus tachycardia  QRS Axis: normal  Intervals: normal  ST/T Wave abnormalities: normal  Conduction Disutrbances:none  Narrative Interpretation:   Old EKG Reviewed: unchanged    MDM  I personally performed the services described in this documentation, which was scribed in my presence. The recorded information has been reviewed and is accurate.    Loren Racer, MD 11/24/12 1318

## 2012-11-24 NOTE — H&P (Signed)
Hospital Admission Note Date: 11/24/2012  Patient name: Billy [name] Medical record number: [id number] Date of birth: Nov 08, 1945 Age: 67 y.o. Gender: male PCP: Sandrea Hughs, MD  _________________________________________________________ INTERNAL MEDICINE TEACHING SERVICE CONTACT INFO     Weekday Hours (7AM-5PM): ** If no return call within 15 minutes (after trying both pagers listed below), please call after hours pagers.    First Contact:  Dr. Collier Bullock   Pager:  [phone] Second Contact:   Dr. Manson Passey   Pager:  [phone]      After Hours (after 5PM)/ Weekend / Holidays: First Contact:              Pager: [phone] Second Contact:         Pager: [phone] _________________________________________________________   Chief Complaint: Shortness of breath  History of Present Illness:   Billy [name] is a 67 year old gentleman with a history of COPD/asthma with frequent exacerbations, chronic sinusitis/rhinitis, previous tobacco user,2 who presents with 2 days of worsening shortness of breath.  Patient states that two days ago he developed fever, muscle aches, cough, headache,and worsening shortness of breath. He tried using his albuterol inhaler more frequently (up to 10 times per day) but his shortness of breath did not improve. He notes worsening nasal congestion and runny nose with clear mucus. Cough is nonproductive, although, since receiving nebulizer treatments in the ED he has noticed cough productive of clear sputum. On the day of admission, he developed dizziness and felt like he was struggling to breath so he came to the ED.  ROS + for increased swelling in legs over past few months.   He did not receive the flu shot this year. Denies sick contacts. Denies chest pain on exertion.  Smoked 1/2 carton of cigarettes per day, quit 1990 cold [place].  Of note, patient desatted to 79% on room air when talking to admitting team. We applied 4L via mask (no [place] since pt with severe nasal  congestion)  Meds:   Medication List    ASK your doctor about these medications       albuterol (2.5 MG/3ML) 0.083% nebulizer solution  Commonly known as:  PROVENTIL  Take 2.5 mg by nebulization every 4 (four) hours as needed for wheezing or shortness of breath. For shortness of breath     aspirin EC 81 MG tablet  Take 81 mg by mouth daily.        Allergies: Allergies as of 11/24/2012  . (No Known Allergies)   Past Medical History  Diagnosis Date  . Intermittent asthma   . Rhinitis   . COPD (chronic obstructive pulmonary disease)    Past Surgical History  Procedure Laterality Date  . Cholecystectomy  2009   History reviewed. No pertinent family history. History   Social History  . Marital Status: Widowed    Spouse Name: N/A    Number of Children: 2  . Years of Education: N/A   Occupational History  . Owns retail store    Social History Main Topics  . Smoking status: Former Smoker -- 2.00 packs/day for 15 years    Types: Cigarettes    Quit date: 10/11/1987  . Smokeless tobacco: Never Used  . Alcohol Use: No  . Drug Use: No  . Sexually Active: Not on file   Other Topics Concern  . Not on file   Social History Narrative  . No narrative on file    Review of Systems: Pertinent items noted in HPI  Physical Exam Blood pressure 118/74, pulse  121, temperature 98.8 F (37.1 C), temperature source Oral, resp. rate 30, SpO2 97.00%. General:  alert and oriented x 3, sitting up on the side of the stretcher HEENT:  PERRL, EOMI, moist mucous membranes, copious clear nasal discharge, clearly visualized nasal mucosa, oropharynx clear Cardiovascular: tachycardic to 120s, regular rhythm, no murmurs Respiratory:  Minimal wheezes throughout, moderate air movement, decreased BS at bases, no obvious crackles Abdomen:  Soft, nondistended, nontender, bowel sounds present Extremities:  Warm and well-perfused, 1+ edema bilateral LE, symmetric Skin: Warm, dry, no  rashes Neuro: Somewhat anxious appearing, normal affect  Lab results: Basic Metabolic Panel:  Recent Labs  78/29/56 0949  NA 134*  K 4.2  CL 94*  CO2 28  GLUCOSE 110*  BUN 16  CREATININE 1.03  CALCIUM 10.0   Liver Function Tests:  Recent Labs  11/24/12 0949  AST 33  ALT 25  ALKPHOS 99  BILITOT 0.8  PROT 7.9  ALBUMIN 4.2   CBC:  Recent Labs  11/24/12 0949  WBC 10.1  NEUTROABS 9.3*  HGB 16.1  HCT 46.9  MCV 87.5  PLT 242   Cardiac Enzymes:  Recent Labs  11/24/12 0949  TROPONINI <0.30      Imaging results:  Dg Chest Port 1 View  11/24/2012  *RADIOLOGY REPORT*  Clinical Data: Shortness of breath, COPD.  PORTABLE CHEST - 1 VIEW  Comparison: 02/01/2012  Findings: Hyperinflation of the lungs.  Heart is normal size.  No confluent airspace opacities or effusions.  No acute bony abnormality.  IMPRESSION: COPD.  No acute findings.   Original Report Authenticated By: Charlett Nose, M.D.      Assessment & Plan by Problem: Principal Problem:   COPD with acute exacerbation  Acute COPD exacerbation with hypoxemia Patient presents with 2 days of worsening SOB, fever, cough, myalgias. Known history of COPD/asthma with hospitalizations in the past (denies intubation). He declined daily suppressive therapy with Spiriva saying he doesn't like taking medicines daily, despite being counseled by pulmonologist that he needs it. Found to be hypoxic to 81% on RA on arrival to the ED. Patient tachycardic to 120s, tachypneic. BP WNL. Labs remarkable only for mild hyponatremia 134, normal white count but diff with 91% neutrophil predominance. Troponins negative. CXR with CXR without significant consolidation, no pulmonary edema, hyperinflation noted. Given with patients history, wheezing, and symptoms, COPD exac is the most likely cause of this patients shortness of breath. Also on the differential is influenza as patient has not had the flu shot this year and endorses flu-like  symptoms of myalgias and headache. Patient's labs also suggest a left shift, which is consistent with bacterial infection. Patient given levaquin, solumedrol and nebs in the ED  -admit to med surg -prednisone 40mg  daily -scheduled duonebs - xopenex and atrovent q4 -doxycycline 100mg  q12 -check flu PCR and start tamiflu, droplet isolation -supplemental O2 as needed to keep O2 sats betwee 90-92%, avoid oxygen toxicity with excessive oxygen (avoid using nasal cannula as patient has severe nasal congestion currently) -will need continued education about daily maintenance therapy for COPD - start Symbicort  Lower extremity edema Patient also presents with 3 months of worsening lower extremity edema of unknown origin. He denies any cardiac history, never had an MI or dx with heart failure. He does have worsening exercise tolerance but this may be from his severe COPD/asthma. No calf tenderness to suggest DVT and the bilateral swelling makes DVT less likely also. No pulmonary edema to suggest L sided heart failure. Edema is  most likely from venous stasis. Can consider further cardiac work up later if indicated.  -apply ted hose  DVT ppx -lovenox  Diet -regular  Code -full    Signed: Denton Ar 11/24/2012, 12:13 PM

## 2012-11-24 NOTE — ED Notes (Addendum)
Pt reports fevers, productive cough and feeling sob. spo2 81% on room air at triage,

## 2012-11-25 DIAGNOSIS — J111 Influenza due to unidentified influenza virus with other respiratory manifestations: Secondary | ICD-10-CM | POA: Diagnosis present

## 2012-11-25 LAB — COMPREHENSIVE METABOLIC PANEL
ALT: 20 U/L (ref 0–53)
BUN: 21 mg/dL (ref 6–23)
CO2: 26 mEq/L (ref 19–32)
Calcium: 8.9 mg/dL (ref 8.4–10.5)
GFR calc Af Amer: >90 mL/min (ref >90)
GFR calc non Af Amer: 85 mL/min — ABNORMAL LOW (ref >90)
Glucose, Bld: 114 mg/dL — ABNORMAL HIGH (ref 70–99)
Sodium: 134 mEq/L — ABNORMAL LOW (ref 135–145)

## 2012-11-25 LAB — CBC
Hemoglobin: 14.1 g/dL (ref 13.0–17.0)
MCHC: 32.5 g/dL (ref 30.0–36.0)
RDW: 13 % (ref 11.5–15.5)
WBC: 9.3 10*3/uL (ref 4.0–10.5)

## 2012-11-25 MED ORDER — LEVALBUTEROL HCL 0.63 MG/3ML IN NEBU
0.6300 mg | INHALATION_SOLUTION | Freq: Four times a day (QID) | RESPIRATORY_TRACT | Status: DC
  Administered 2012-11-26 (×2): 0.63 mg via RESPIRATORY_TRACT
  Filled 2012-11-25 (×6): qty 3

## 2012-11-25 MED ORDER — IPRATROPIUM BROMIDE 0.02 % IN SOLN
0.5000 mg | Freq: Four times a day (QID) | RESPIRATORY_TRACT | Status: DC
  Administered 2012-11-26 (×2): 0.5 mg via RESPIRATORY_TRACT
  Filled 2012-11-25 (×2): qty 2.5

## 2012-11-25 MED ORDER — GUAIFENESIN-CODEINE 100-10 MG/5ML PO SOLN: 5.0000 mL | Freq: Four times a day (QID) | ORAL | Status: DC | PRN

## 2012-11-25 NOTE — Progress Notes (Signed)
Resident Co-sign Daily Note: I have seen the patient and reviewed the daily progress note by Jaye Beagle MS 3 and discussed the care of the patient with them.  See my notes for documentation of my findings, assessment, and plans.   Denton Ar 11/25/2012, 10:16 AM

## 2012-11-25 NOTE — H&P (Signed)
Internal Medicine Teaching Service Attending Note Date: 11/25/2012  Patient name: Billy [name]  Medical record number: [id number]  Date of birth: Jan 17, 1946   CHIEF COMPLAINT Cough, shortness of breath  HISTORY OF PRESENT ILLNESS The patient, Billy [name], is a 67 y.o. year old caucasian male, with past medical history of COPD, who comes in with the chief  complaint of cough and shortness of breath for the past 2 days . I have read the history documented by [facility] and I concur with the chronology of events documented.   When I met with the patient today, he seemed comfortable, and said he felt better. I informed him that his influenza testing for H1N1 has come back positive and he appeared dismayed. He agreed that he has been non-compliant with his medications, however said that it was because of his financial condition. He reported cough with greenish expectoration, body pain, dyspnea getting better, some wheezing, and initial chest tighness (upon presentation which has receded now). He said he was experiencing subjective fevers at home, but has not felt any since he came to the hospital.  PAST MEDICAL HISTORY  has a past medical history of Intermittent asthma; Rhinitis; and COPD (chronic obstructive pulmonary disease).  HOME MEDICATIONS Reviewed.  FAMILY AND SOCIAL HISTORY  reports that he quit smoking about 25 years ago. His smoking use included Cigarettes. He has a 30 pack-year smoking history. He has never used smokeless tobacco. He reports that he does not drink alcohol or use illicit drugs. family history is not on file.   REVIEW OF SYSTEMS Constitutional: Admits fever, chills, diaphoresis, appetite change and fatigue.  HEENT: Denies photophobia, eye pain, redness, hearing loss, ear pain, congestion, sore throat, rhinorrhea, sneezing, mouth sores, trouble swallowing, neck pain, neck stiffness and tinnitus.   Respiratory: Admits SOB, DOE, cough, chest tightness,  and  wheezing.   Cardiovascular: Denies chest pain, palpitations and leg swelling.  Gastrointestinal: Denies nausea, vomiting, abdominal pain, diarrhea, constipation, blood in stool and abdominal distention.  Genitourinary: Denies dysuria, urgency, frequency, hematuria, flank pain and difficulty urinating.  Musculoskeletal: Admits myalgias. Denies back pain, joint swelling, arthralgias and gait problem.  Skin: Denies pallor, rash and wound.  Neurological: Denies dizziness, seizures, syncope, weakness, light-headedness, numbness and headaches.    SERIAL VITALS Filed Vitals:   11/24/12 2338 11/25/12 0338 11/25/12 0616 11/25/12 0856  BP:   126/73   Pulse:   88   Temp:   97.5 F (36.4 C)   TempSrc:   Oral   Resp:   17   Height:      Weight:      SpO2: 97% 94% 94% 93%    PHYSICAL EXAM  I met with patient around 9 am today  General: Resting in bed. HEENT: PERRL, EOMI, no scleral icterus. Heart: RRR, no rubs, murmurs or gallops. Lungs: Few wheezes, and rhonchi. No rales. Abdomen: Soft, nontender, nondistended, BS present. Extremities: Warm, mild pedal edema. Neuro: Alert and oriented X3, cranial nerves II-XII grossly intact,  strength and sensation to light touch equal in bilateral upper and lower extremities  LAB RESULTS  CMP   Recent Labs Lab 11/24/12 0949 11/25/12 0535  NA 134* 134*  K 4.2 4.2  CL 94* 99  CO2 28 26  GLUCOSE 110* 114*  BUN 16 21  CREATININE 1.03 0.95  CALCIUM 10.0 8.9    Recent Labs Lab 11/24/12 0949 11/25/12 0535  AST 33 23  ALT 25 20  ALKPHOS 99 73  BILITOT 0.8 0.3  PROT 7.9 6.3  ALBUMIN 4.2 3.2*    CBC  Recent Labs Lab 11/24/12 0949 11/25/12 0535  HGB 16.1 14.1  HCT 46.9 40.6  WBC 10.1 9.3  PLT 242 210     Recent Labs Lab 11/24/12 0949  TROPONINI <0.30   Urinalysis    Component Value Date/Time   COLORURINE YELLOW 12/04/2010 2113   APPEARANCEUR CLOUDY* 12/04/2010 2113   LABSPEC 1.019 12/04/2010 2113   PHURINE 5.0  12/04/2010 2113   HGBUR NEGATIVE 12/04/2010 2113   BILIRUBINUR NEGATIVE 12/04/2010 2113   KETONESUR 15* 12/04/2010 2113   PROTEINUR NEGATIVE 12/04/2010 2113   UROBILINOGEN 0.2 12/04/2010 2113   NITRITE NEGATIVE 12/04/2010 2113   LEUKOCYTESUR NEGATIVE MICROSCOPIC NOT DONE ON URINES WITH NEGATIVE PROTEIN, BLOOD, LEUKOCYTES, NITRITE, OR GLUCOSE <1000 mg/dL. 12/04/2010 2113    IMAGING RESULTS Dg Chest Port 1 View  11/24/2012  *RADIOLOGY REPORT*  Clinical Data: Shortness of breath, COPD.  PORTABLE CHEST - 1 VIEW  Comparison: 02/01/2012  Findings: Hyperinflation of the lungs.  Heart is normal size.  No confluent airspace opacities or effusions.  No acute bony abnormality.  IMPRESSION: COPD.  No acute findings.   Original Report Authenticated By: Charlett Nose, M.D.      EKG on 11/24/12 Sinus Tachycardia, normal axis, shaky baseline - cannot comment on ST segment, seemingly non-significant changes.    ASSESSMENT AND PLAN   This is a 67 y.o. year old male with COPD exacerbation and influenza.  I agree with the plan of the resident team with the following additions/thoughts:  1. COPD Exacerbation & H1N1 Influenza - I agree with doxycycline, Tamiflu and prednisone therapy. For now he seems to be getting better, however, if the wheezing increases, I would switch to IV solumedrol.   2. Bilateral mild leg edema - It could be venous stasis, right heart dysfunction (secondary to COPD). If after successful treatment for COPD and Flu, the patient keeps having SOB, then an ECHO as outpatient should be considered. EKG is too shaky to comment on P waves.   Other chronic issues per resident note.    Thanks, Aletta Edouard, MD 2/16/201410:00 AM

## 2012-11-25 NOTE — Progress Notes (Signed)
Placed patient on room air at 00:15. Returned to check patients oxygen saturations at 0135. Patients Sp02 was 95% and patient had no c/o shortness of breath. Will continue to monitor patient

## 2012-11-25 NOTE — Progress Notes (Signed)
Medical Student Daily Progress Note  Subjective: Pt notes improvement of SOB and is off continuous oxygen this morning.  The pt reports that he does not like to be on continuous therapy, so he requested that the nurse stop it, which was ok b/c his sats were in the mid-90s off oxygen at that time.  The main bothersome sx for the pt is his cough and congestion, which he feels improved slightly since yesterday (although he did have tissues in his nose throughout the exam).  Appetite is good.  Objective: Vital signs in last 24 hours: Filed Vitals:   11/24/12 2338 11/25/12 0338 11/25/12 0616 11/25/12 0856  BP:   126/73   Pulse:   88   Temp:   97.5 F (36.4 C)   TempSrc:   Oral   Resp:   17   Height:      Weight:      SpO2: 97% 94% 94% 93%   Weight change:   Intake/Output Summary (Last 24 hours) at 11/25/12 0934 Last data filed at 11/24/12 1700  Gross per 24 hour  Intake      0 ml  Output      1 ml  Net     -1 ml   Physical Exam: BP 126/73  Pulse 88  Temp(Src) 97.5 F (36.4 C) (Oral)  Resp 17  Ht 5\' 9"  (1.753 m)  Wt 86.546 kg (190 lb 12.8 oz)  BMI 28.16 kg/m2  SpO2 93% General appearance: alert, cooperative, no distress and sitting up in bed, appropriately answering questions Lungs: NWOB.  Good air movement, with scant wheezes appreciated (R>L).  No crackles appreciated. Heart: regular rate and rhythm, S1, S2 normal, no murmur, click, rub or gallop Abdomen: soft, non-tender; bowel sounds normal; no masses,  no organomegaly Extremities: Warm, well-perfused, trace BLE edema  Lab Results: Results for orders placed during the hospital encounter of 11/24/12 (from the past 24 hour(s))  CBC WITH DIFFERENTIAL     Status: Abnormal   Collection Time    11/24/12  9:49 AM      Result Value Range   WBC 10.1  4.0 - 10.5 K/uL   RBC 5.36  4.22 - 5.81 MIL/uL   Hemoglobin 16.1  13.0 - 17.0 g/dL   HCT 16.1  09.6 - 04.5 %   MCV 87.5  78.0 - 100.0 fL   MCH 30.0  26.0 - 34.0 pg   MCHC  34.3  30.0 - 36.0 g/dL   RDW 40.9  81.1 - 91.4 %   Platelets 242  150 - 400 K/uL   Neutrophils Relative 91 (*) 43 - 77 %   Neutro Abs 9.3 (*) 1.7 - 7.7 K/uL   Lymphocytes Relative 4 (*) 12 - 46 %   Lymphs Abs 0.4 (*) 0.7 - 4.0 K/uL   Monocytes Relative 4  3 - 12 %   Monocytes Absolute 0.4  0.1 - 1.0 K/uL   Eosinophils Relative 1  0 - 5 %   Eosinophils Absolute 0.1  0.0 - 0.7 K/uL   Basophils Relative 0  0 - 1 %   Basophils Absolute 0.0  0.0 - 0.1 K/uL  COMPREHENSIVE METABOLIC PANEL     Status: Abnormal   Collection Time    11/24/12  9:49 AM      Result Value Range   Sodium 134 (*) 135 - 145 mEq/L   Potassium 4.2  3.5 - 5.1 mEq/L   Chloride 94 (*) 96 - 112 mEq/L  CO2 28  19 - 32 mEq/L   Glucose, Bld 110 (*) 70 - 99 mg/dL   BUN 16  6 - 23 mg/dL   Creatinine, Ser 4.78  0.50 - 1.35 mg/dL   Calcium 29.5  8.4 - 62.1 mg/dL   Total Protein 7.9  6.0 - 8.3 g/dL   Albumin 4.2  3.5 - 5.2 g/dL   AST 33  0 - 37 U/L   ALT 25  0 - 53 U/L   Alkaline Phosphatase 99  39 - 117 U/L   Total Bilirubin 0.8  0.3 - 1.2 mg/dL   GFR calc non Af Amer 74 (*) >90 mL/min   GFR calc Af Amer 86 (*) >90 mL/min  TROPONIN I     Status: None   Collection Time    11/24/12  9:49 AM      Result Value Range   Troponin I <0.30  <0.30 ng/mL  INFLUENZA PANEL BY PCR     Status: Abnormal   Collection Time    11/24/12 12:21 PM      Result Value Range   Influenza A By PCR POSITIVE (*) NEGATIVE   Influenza B By PCR NEGATIVE  NEGATIVE   H1N1 flu by pcr DETECTED (*) NOT DETECTED  COMPREHENSIVE METABOLIC PANEL     Status: Abnormal   Collection Time    11/25/12  5:35 AM      Result Value Range   Sodium 134 (*) 135 - 145 mEq/L   Potassium 4.2  3.5 - 5.1 mEq/L   Chloride 99  96 - 112 mEq/L   CO2 26  19 - 32 mEq/L   Glucose, Bld 114 (*) 70 - 99 mg/dL   BUN 21  6 - 23 mg/dL   Creatinine, Ser 3.08  0.50 - 1.35 mg/dL   Calcium 8.9  8.4 - 65.7 mg/dL   Total Protein 6.3  6.0 - 8.3 g/dL   Albumin 3.2 (*) 3.5 - 5.2 g/dL    AST 23  0 - 37 U/L   ALT 20  0 - 53 U/L   Alkaline Phosphatase 73  39 - 117 U/L   Total Bilirubin 0.3  0.3 - 1.2 mg/dL   GFR calc non Af Amer 85 (*) >90 mL/min   GFR calc Af Amer >90  >90 mL/min  CBC     Status: None   Collection Time    11/25/12  5:35 AM      Result Value Range   WBC 9.3  4.0 - 10.5 K/uL   RBC 4.65  4.22 - 5.81 MIL/uL   Hemoglobin 14.1  13.0 - 17.0 g/dL   HCT 84.6  96.2 - 95.2 %   MCV 87.3  78.0 - 100.0 fL   MCH 28.4  26.0 - 34.0 pg   MCHC 32.5  30.0 - 36.0 g/dL   RDW 84.1  32.4 - 40.1 %   Platelets 210  150 - 400 K/uL   Micro Results: No results found for this or any previous visit (from the past 240 hour(s)). Studies/Results: Dg Chest Port 1 View  11/24/2012  *RADIOLOGY REPORT*  Clinical Data: Shortness of breath, COPD.  PORTABLE CHEST - 1 VIEW  Comparison: 02/01/2012  Findings: Hyperinflation of the lungs.  Heart is normal size.  No confluent airspace opacities or effusions.  No acute bony abnormality.  IMPRESSION: COPD.  No acute findings.   Original Report Authenticated By: Charlett Nose, M.D.    Medications: I have reviewed the patient's current  medications. Scheduled Meds: . budesonide-formoterol  2 puff Inhalation BID  . doxycycline  100 mg Oral Q12H  . enoxaparin (LOVENOX) injection  40 mg Subcutaneous Q24H  . influenza  inactive virus vaccine  0.5 mL Intramuscular Tomorrow-1000  . ipratropium  0.5 mg Nebulization Q4H  . levalbuterol  0.63 mg Nebulization Q4H  . oseltamivir  75 mg Oral BID  . pneumococcal 23 valent vaccine  0.5 mL Intramuscular Tomorrow-1000  . predniSONE  40 mg Oral Q breakfast   Continuous Infusions:  PRN Meds:.acetaminophen, acetaminophen, guaiFENesin-codeine, ondansetron (ZOFRAN) IV, ondansetron Assessment/Plan: Mr. Manes is a 67yo M former heavy smoker with hx of COPD, who is being treated for an AECOPD likely 2/2 influenza.  1. AECOPD:  Clinical sx align with moderate AECOPD.  He is stable with no oxygen requirement and  improving clinical sx.  Influenza PCR confirms acute influenza A and H1N1 infections, which likely caused a secondary COPD exacerbation.  Will treat with tamiflu to shorten course.  Since he has been hospitalized with a moderate exacerbation, antibiotic coverage is indicated, so will cover with doxycycline (studies have shown significant clinical benefit at 10 days from doxycycline).  He has been, however, afebrile since admission with a nl WBC count (9.3-10.1).  With his clinical improvement and no oxygen requirement, we will continue him on COPD exacerbation protocol and the above medicines through the day with potential discharge tomorrow if still stable.  For control of his baseline COPD, he has been prescribed long-term therapy in the past without compliance.  We will initiate a combined ICS-long acting beta agonist (Symbicort) while he is in-house to try to attain better control of his underlying chronic disease. - Continue duonebs - Continue tamiflu (day 2) - Continue doxycycline (day 2) - Oxygen prn for hypoxia - Initiate symbicort  2. BLE edema: This was described in the H&P as a worsening sx over the past 16mo with unknown origin.  He does not have history or clinical sx of heart disease or heart failure (no pulmonary edema).  BL nature of swelling and lack of TTP make DVT unlikely.  Most likely etiology is chronic venous stasis v. exercise intolerance from COPD.  Can consider a cardiac or outpt workup if indicated in future.  We offered the patient compression stockings, but he was not wearing them at the time of exam.  We will maintain DVT ppx with Lovenox. - Lovenox for DVT ppx  3. FEN/GI: Regular diet.  4. Dispo: Floor status.  Will likely d/c tomorrow with continued improvement of sx.   LOS: 1 day   This is a Psychologist, occupational Note.  The care of the patient was discussed with Dr. Collier Bullock and the assessment and plan formulated with their assistance.  Please see their attached note for  official documentation of the daily encounter.  Wynona Meals Amy 11/25/2012, 9:34 AM

## 2012-11-25 NOTE — Progress Notes (Signed)
Subjective: Patient states that his breathing is doing better this morning. He still has a cough and complains of rib pain. Continued nasal congestion and mucus production. He has tissues in his nose during the exam.  Denies fever, chills, chest pain.  Objective: Vital signs in last 24 hours: Filed Vitals:   11/24/12 2106 11/24/12 2338 11/25/12 0338 11/25/12 0616  BP: 109/69   126/73  Pulse: 96   88  Temp: 96.9 F (36.1 C)   97.5 F (36.4 C)  TempSrc: Oral   Oral  Resp: 20   17  Height: 5\' 9"  (1.753 m)     Weight: 190 lb 12.8 oz (86.546 kg)     SpO2: 94% 97% 94% 94%   Weight change:   Intake/Output Summary (Last 24 hours) at 11/25/12 0830 Last data filed at 11/24/12 1700  Gross per 24 hour  Intake      0 ml  Output      1 ml  Net     -1 ml    Physical Exam Blood pressure 126/73, pulse 88, temperature 97.5 F (36.4 C), temperature source Oral, resp. rate 17, height 5\' 9"  (1.753 m), weight 190 lb 12.8 oz (86.546 kg), SpO2 93.00%. General:  No acute distress, alert and oriented x 3, well-appearing  HEENT:  PERRL, EOMI, moist mucous membranes, copious clear nasal secretions Cardiovascular:  Regular rate and rhythm, no murmurs Respiratory:  Clear to auscultation bilaterally, no wheezes, rales, or rhonchi, moving good air Abdomen:  Soft, nondistended, nontender, bowel sounds present Extremities:  Warm and well-perfused, 1+ edema b/l Skin: Warm, dry, no rashes Neuro: Not anxious appearing, no depressed mood, normal affect  Lab Results: Basic Metabolic Panel:  Recent Labs Lab 11/24/12 0949 11/25/12 0535  NA 134* 134*  K 4.2 4.2  CL 94* 99  CO2 28 26  GLUCOSE 110* 114*  BUN 16 21  CREATININE 1.03 0.95  CALCIUM 10.0 8.9   Liver Function Tests:  Recent Labs Lab 11/24/12 0949 11/25/12 0535  AST 33 23  ALT 25 20  ALKPHOS 99 73  BILITOT 0.8 0.3  PROT 7.9 6.3  ALBUMIN 4.2 3.2*   CBC:  Recent Labs Lab 11/24/12 0949 11/25/12 0535  WBC 10.1 9.3  NEUTROABS  9.3*  --   HGB 16.1 14.1  HCT 46.9 40.6  MCV 87.5 87.3  PLT 242 210   Cardiac Enzymes:  Recent Labs Lab 11/24/12 0949  TROPONINI <0.30    Studies/Results: Dg Chest Port 1 View  11/24/2012  *RADIOLOGY REPORT*  Clinical Data: Shortness of breath, COPD.  PORTABLE CHEST - 1 VIEW  Comparison: 02/01/2012  Findings: Hyperinflation of the lungs.  Heart is normal size.  No confluent airspace opacities or effusions.  No acute bony abnormality.  IMPRESSION: COPD.  No acute findings.   Original Report Authenticated By: Charlett Nose, M.D.    Medications:  Medications reviewed  Scheduled Meds: . budesonide-formoterol  2 puff Inhalation BID  . doxycycline  100 mg Oral Q12H  . enoxaparin (LOVENOX) injection  40 mg Subcutaneous Q24H  . influenza  inactive virus vaccine  0.5 mL Intramuscular Tomorrow-1000  . ipratropium  0.5 mg Nebulization Q4H  . levalbuterol  0.63 mg Nebulization Q4H  . oseltamivir  75 mg Oral BID  . pneumococcal 23 valent vaccine  0.5 mL Intramuscular Tomorrow-1000  . predniSONE  40 mg Oral Q breakfast   Continuous Infusions:  PRN Meds:.acetaminophen, acetaminophen, ondansetron (ZOFRAN) IV, ondansetron  Assessment/Plan:  Influenza Patient presented with SOB, myalgias,  headache and tested positive for Influenza A and H1N1. Symptoms likely a combination of flu and COPD exacerbation  -Cont tamiflu, droplet isolation -cont supportive care, IVF, antipyretics -will monitor for secondary bacterial infection -gaifen-codeine syrup PRN for cough -cont COPD care as below  Acute COPD exacerbation with hypoxemia  Patient presents with 2 days of worsening SOB, fever, cough, myalgias. Known history of COPD/asthma with hospitalizations in the past (denies intubation). He declined daily suppressive therapy with Spiriva saying he doesn't like taking medicines daily, despite being counseled by pulmonologist that he needs it.  Found to be hypoxic to 81% on RA on arrival to the ED.  Patient tachycardic to 120s, tachypneic. BP WNL. Labs remarkable only for mild hyponatremia 134, normal white count but diff with 91% neutrophil predominance. Troponins negative. CXR without significant consolidation, no pulmonary edema, hyperinflation noted.  Given with patients history, wheezing, and symptoms, COPD exac is the most likely cause of this patients shortness of breath with influenza likely contributing as well. Patient's labs also suggest a left shift, which is concerning for bacterial infection, although patient has no other signs. Patient given levaquin, solumedrol and nebs in the ED.  -prednisone 40mg  daily, slow taper -scheduled duonebs - xopenex and atrovent q4  -doxycycline 100mg  q12  -supplemental O2 as needed to keep O2 sats between 90-92%, avoid oxygen toxicity with excessive oxygen (avoid using nasal cannula as patient has severe nasal congestion currently)  -will need continued education about daily maintenance therapy for COPD - cont Symbicort   Lower extremity edema  Patient also presents with 3 months of worsening lower extremity edema of unknown origin. He denies any cardiac history, never had an MI or dx with heart failure. He does have worsening exercise tolerance but this may be from his severe COPD/asthma.  No calf tenderness to suggest DVT and the bilateral swelling makes DVT less likely also. No pulmonary edema to suggest L sided heart failure. Edema is most likely from venous stasis. Can consider further cardiac work up later if indicated.  -ted hose   DVT ppx  -lovenox   Diet  -regular   Code  -full      LOS: 1 day   [place] Ar 11/25/2012, 8:30 AM

## 2012-11-26 DIAGNOSIS — J111 Influenza due to unidentified influenza virus with other respiratory manifestations: Secondary | ICD-10-CM

## 2012-11-26 MED ORDER — PREDNISONE 10 MG PO TABS: ORAL_TABLET | ORAL | Status: DC

## 2012-11-26 MED ORDER — DOXYCYCLINE HYCLATE 100 MG PO TABS: 100.0000 mg | ORAL_TABLET | Freq: Two times a day (BID) | ORAL | Status: AC

## 2012-11-26 MED ORDER — BUDESONIDE-FORMOTEROL FUMARATE 160-4.5 MCG/ACT IN AERO: 2.0000 | INHALATION_SPRAY | Freq: Two times a day (BID) | RESPIRATORY_TRACT | Status: DC

## 2012-11-26 MED ORDER — OSELTAMIVIR PHOSPHATE 75 MG PO CAPS: 75.0000 mg | ORAL_CAPSULE | Freq: Two times a day (BID) | ORAL | Status: AC

## 2012-11-26 MED ORDER — ALBUTEROL SULFATE (2.5 MG/3ML) 0.083% IN NEBU: 2.5000 mg | INHALATION_SOLUTION | RESPIRATORY_TRACT | Status: DC | PRN

## 2012-11-26 MED ORDER — DEXTROMETHORPHAN HBR 15 MG/5ML PO SYRP: 10.0000 mL | ORAL_SOLUTION | Freq: Three times a day (TID) | ORAL | Status: DC | PRN

## 2012-11-26 MED ORDER — GUAIFENESIN ER 600 MG PO TB12: 1200.0000 mg | ORAL_TABLET | Freq: Two times a day (BID) | ORAL | Status: DC

## 2012-11-26 NOTE — Progress Notes (Signed)
Resident Co-sign Daily Note: I have seen the patient and reviewed the daily progress note by Jaye Beagle MS 3 and discussed the care of the patient with them.  See below for documentation of my findings, assessment, and plans.  Subjective: Patient doing well this morning. Feels like his breathing is much improved. No fever or chills. Still have a nagging cough with some rib pain when coughing. Cannot lay flat due to coughing.  Denies CP.  Objective: Vital signs in last 24 hours: Filed Vitals:   11/25/12 2008 11/26/12 0143 11/26/12 0459 11/26/12 0958  BP: 122/61  136/79 112/70  Pulse: 96  75 87  Temp: 98.2 F (36.8 C)  98 F (36.7 C) 96.4 F (35.8 C)  TempSrc: Oral  Oral   Resp: 18  18 20   Height:      Weight: 175 lb 0.7 oz (79.4 kg)     SpO2: 93% 95% 98% 94%   Physical Exam Blood pressure 112/70, pulse 87, temperature 96.4 F (35.8 C), temperature source Oral, resp. rate 20, height 5\' 9"  (1.753 m), weight 175 lb 0.7 oz (79.4 kg), SpO2 94.00%. General:  No acute distress, alert and oriented x 3, well-appearing  HEENT:  PERRL, EOMI, moist mucous membranes Cardiovascular:  Regular rate and rhythm, no murmurs, rubs or gallops Respiratory:  Good air movement, small diffuse wheezes throughout, no crackles Abdomen:  Soft, nondistended, nontender, bowel sounds present Extremities:  Warm and well-perfused, no edema.  Skin: Warm, dry, no rashes Neuro: Not anxious appearing, no depressed mood, normal affect  Lab Results: Reviewed and documented in Electronic Record Micro Results: Reviewed and documented in Electronic Record Studies/Results: Reviewed and documented in Electronic Record  Medications: medication reviewed Scheduled Meds: . budesonide-formoterol  2 puff Inhalation BID  . doxycycline  100 mg Oral Q12H  . enoxaparin (LOVENOX) injection  40 mg Subcutaneous Q24H  . ipratropium  0.5 mg Nebulization Q6H  . levalbuterol  0.63 mg Nebulization Q6H  . oseltamivir  75 mg Oral  BID  . predniSONE  40 mg Oral Q breakfast   Continuous Infusions:  PRN Meds:.acetaminophen, acetaminophen, guaiFENesin-codeine, ondansetron (ZOFRAN) IV, ondansetron Assessment/Plan: Principal Problem:   COPD with acute exacerbation Active Problems:   Influenza  Influenza  Patient presented with SOB, myalgias, headache and tested positive for Influenza A and H1N1.  Symptoms likely a combination of flu and COPD exacerbation  No signs of secondary bacterial infection, no respiratory failure. Pt improving clinically  -dc with 4 more doses of tamiflu -cough syrup PRN, added mucinex at dc -cont COPD care as below  -counseled on getting flu shot every year  Acute COPD exacerbation with hypoxemia  Patient presents with 2 days of worsening SOB, fever, cough, myalgias. Known history of COPD/asthma with hospitalizations in the past (denies intubation). He declined daily suppressive therapy with Spiriva saying he doesn't like taking medicines daily, despite being counseled by pulmonologist that he needs it.  Found to be hypoxic to 81% on RA on arrival to the ED. Patient tachycardic to 120s, tachypneic. BP WNL. Labs remarkable only for mild hyponatremia 134, normal white count but diff with 91% neutrophil predominance. Troponins negative. CXR without significant consolidation, no pulmonary edema, hyperinflation noted.  Given with patients history, wheezing, and symptoms, COPD exac is the most likely cause of this patients shortness of breath with influenza likely contributing as well. Patient's labs also suggest a left shift, which is concerning for bacterial infection, although patient has no other signs. Patient given levaquin, solumedrol and  nebs in the ED.   2/17: off oxygen, clinically improving. Some wheezes this morning but no inc WOB.  -prednisone 40mg  daily, slow taper  -dc with pred taper, nebulizers, and new Rx for Symbicort with f/u with [name] -discussed that symbicort is not an  instant relief or rescue inhaler and it needs to be taken twice per day for long term effects. Patient voiced understanding. -recommended NSAID course for rib pain with coughing  Lower extremity edema  Patient also presents with 3 months of worsening lower extremity edema of unknown origin. He denies any cardiac history, never had an MI or dx with heart failure. He does have worsening exercise tolerance but this may be from his severe COPD/asthma.  No calf tenderness to suggest DVT and the bilateral swelling makes DVT less likely also. No pulmonary edema to suggest L sided heart failure. Edema is most likely from venous stasis vs from acute COPD exac. Can consider further cardiac work up later if indicated.   DVT ppx  -lovenox   Diet  -regular   Code  -full  Dispo -dc home today with pulmonology d/u on Thursday -also gave patient phone numbers of PCP to establish care with     LOS: 2 days   [place] Ar 11/26/2012, 11:14 AM

## 2012-11-26 NOTE — Progress Notes (Signed)
Medical Student Daily Progress Note  Subjective: Pt reports clinical improvement with decreased SOB, cough, and congestion.  He says that congestion and cough are still there, however, and he experiences diffuse pain with coughing.  He has been having problems sleeping laying flat or on side, but did report getting some sleep overnight, as his ability has improved.  Appetite is ok.  The pt agrees with plan for discharge today.  Objective: Vital signs in last 24 hours: Filed Vitals:   11/25/12 1922 11/25/12 2008 11/26/12 0143 11/26/12 0459  BP:  122/61  136/79  Pulse:  96  75  Temp:  98.2 F (36.8 C)  98 F (36.7 C)  TempSrc:  Oral  Oral  Resp:  18  18  Height:      Weight:  79.4 kg (175 lb 0.7 oz)    SpO2: 95% 93% 95% 98%   Weight change: 1.9 kg (4 lb 3 oz)  Intake/Output Summary (Last 24 hours) at 11/26/12 0834 Last data filed at 11/25/12 1820  Gross per 24 hour  Intake    720 ml  Output      0 ml  Net    720 ml   Physical Exam: BP 136/79  Pulse 75  Temp(Src) 98 F (36.7 C) (Oral)  Resp 18  Ht 5\' 9"  (1.753 m)  Wt 79.4 kg (175 lb 0.7 oz)  BMI 25.84 kg/m2  SpO2 98% General appearance: alert, cooperative, no distress and sitting up comfortably in bed Lungs: expiratory wheezes through all lung bases; no crackles appreciated Heart: regular rate and rhythm, S1, S2 normal, no murmur, click, rub or gallop Abdomen: soft, non-tender; bowel sounds normal; no masses,  no organomegaly Extremities: no peripheral edema appreciated  Lab Results: No results found for this or any previous visit (from the past 24 hour(s)). Micro Results: No results found for this or any previous visit (from the past 240 hour(s)). Studies/Results: Dg Chest Port 1 View  11/24/2012  *RADIOLOGY REPORT*  Clinical Data: Shortness of breath, COPD.  PORTABLE CHEST - 1 VIEW  Comparison: 02/01/2012  Findings: Hyperinflation of the lungs.  Heart is normal size.  No confluent airspace opacities or effusions.  No  acute bony abnormality.  IMPRESSION: COPD.  No acute findings.   Original Report Authenticated By: Charlett Nose, M.D.    Medications: I have reviewed the patient's current medications. Scheduled Meds: . budesonide-formoterol  2 puff Inhalation BID  . doxycycline  100 mg Oral Q12H  . enoxaparin (LOVENOX) injection  40 mg Subcutaneous Q24H  . ipratropium  0.5 mg Nebulization Q6H  . levalbuterol  0.63 mg Nebulization Q6H  . oseltamivir  75 mg Oral BID  . predniSONE  40 mg Oral Q breakfast   Continuous Infusions:  PRN Meds:.acetaminophen, acetaminophen, guaiFENesin-codeine, ondansetron (ZOFRAN) IV, ondansetron  Assessment/Plan: Mr. Billy [name] is a 67yo M former heavy smoker with hx of COPD/asthma, who is being treated for an AECOPD likely 2/2 influenza.   1. AECOPD: Clinical sx continue to improve with tx.  No oxygen requirement and hemodynamically stable, so appropriate for d/c.   - Symbicort for chronic management; Pt has agreed with taking this medication - Continue tamiflu (Has 3 doses; will need 4 more doses on d/c)  - Continue doxycycline (day 3)  - Prednisone 40mg  daily; wean slowly - F/u with pulmonologist in 1-2 weeks --> appt set for Feb 20, 10am  2. BLE edema: No peripheral edema on today's exam.  Likely 2/2 chronic venous stasis v. Cor pulmonale  from COPD. - Follow-up as outpatient  3. FEN/GI: Regular diet.   4. Dispo: D/c to home self-care today   LOS: 2 days   This is a Psychologist, occupational Note.  The care of the patient was discussed with Dr. Collier Bullock and the assessment and plan formulated with their assistance.  Please see their attached note for official documentation of the daily encounter.  Wynona Meals Amy 11/26/2012, 8:34 AM

## 2012-11-26 NOTE — Progress Notes (Signed)
AVS reviewed with pt; teach back method used. Pt able to verbalize understanding of AVS and questions were answered. IV removed. Pt remains stable. Pt awaiting wheelchair to exit of facility. [place], Rosanna Randy

## 2012-11-26 NOTE — Discharge Summary (Signed)
Internal Medicine Teaching [facility] Discharge Note  Name: Billy [name] MRN: [id number] DOB: 08-10-1946 67 y.o.  Date of Admission: 11/24/2012  9:24 AM Date of Discharge: 11/26/2012 Attending Physician: [name], MD  Discharge Diagnosis: Principal Problem:   COPD with acute exacerbation Active Problems:   Influenza   Discharge Medications:   Medication List    TAKE these medications       albuterol (2.5 MG/3ML) 0.083% nebulizer solution  Commonly known as:  PROVENTIL  Take 3 mLs (2.5 mg total) by nebulization every 4 (four) hours as needed for wheezing or shortness of breath.     aspirin EC 81 MG tablet  Take 81 mg by mouth daily.     budesonide-formoterol 160-4.5 MCG/ACT inhaler  Commonly known as:  SYMBICORT  Inhale 2 puffs into the lungs 2 (two) times daily.     dextromethorphan 15 MG/5ML syrup  Take 10 mLs (30 mg total) by mouth 3 (three) times daily as needed for cough.     doxycycline 100 MG tablet  Commonly known as:  VIBRA-TABS  Take 1 tablet (100 mg total) by mouth every 12 (twelve) hours.     guaiFENesin 600 MG 12 hr tablet  Commonly known as:  MUCINEX  Take 2 tablets (1,200 mg total) by mouth 2 (two) times daily.     oseltamivir 75 MG capsule  Commonly known as:  TAMIFLU  Take 1 capsule (75 mg total) by mouth 2 (two) times daily.     predniSONE 10 MG tablet  Commonly known as:  DELTASONE  Take tablets by mouth: 4 tabs/day for 1 day, 3 tabs/day for 2 days, 2 tabs/day for 2 days, 1 tab/day for 2 days        Disposition and follow-up:   Billy [name] was discharged from [facility] in Stable condition.  At the hospital follow up visit please address the following:  -f/u with [name], pulmonology for COPD management -patient given Rx for symbicort, please follow up on daily use and encourage compliance -consider referral to ENT for sinus evaluation -patient needs to establish care with PCP for screening and  regular medical care  Follow-up Appointments:     Follow-up Information   Follow up with Sandrea Hughs, MD. (Thursday Feb 20 at 10am with [name])    Contact information:   [address] [place] [address] [phone]       Follow up with [facility]. (Please call [facility] to make an appointment with your primary care doctor)    Contact information:   [address] [place] [address] [phone]     Discharge Orders   Future Appointments Provider Department Dept Phone   11/29/2012 10:00 AM Nyoka Cowden, MD [facility] [phone]   Future Orders Complete By Expires     Diet - low sodium heart healthy  As directed     Increase activity slowly  As directed        Consultations:    Procedures Performed:  Dg Chest Port 1 View  11/24/2012  *RADIOLOGY REPORT*  Clinical Data: Shortness of breath, COPD.  PORTABLE CHEST - 1 VIEW  Comparison: 02/01/2012  Findings: Hyperinflation of the lungs.  Heart is normal size.  No confluent airspace opacities or effusions.  No acute bony abnormality.  IMPRESSION: COPD.  No acute findings.   Original Report Authenticated By: Charlett Nose, M.D.      Admission HPI:  Billy [name] is a 67 year old gentleman with a history of COPD/asthma with frequent exacerbations, chronic sinusitis/rhinitis, previous tobacco user,2 who presents with 2 days of worsening shortness of breath.  Patient states that two days ago he developed fever, muscle aches, cough, headache,and worsening shortness of breath. He tried using his albuterol inhaler more frequently (up to 10 times per day) but his shortness of breath did not improve. He notes worsening nasal congestion and runny nose with clear mucus. Cough is nonproductive, although, since receiving nebulizer treatments in the ED he has noticed cough productive of clear sputum. On the day of admission, he developed dizziness and felt like he was  struggling to breath so he came to the ED.  ROS + for increased swelling in legs over past few months.  He did not receive the flu shot this year. Denies sick contacts. Denies chest pain on exertion.  Smoked 1/2 carton of cigarettes per day, quit 1990 cold [place].  Of note, patient desatted to 79% on room air when talking to admitting team. We applied 4L via mask (no [place] since pt with severe nasal congestion)   Hospital Course by problem list: Principal Problem:   COPD with acute exacerbation Active Problems:   Influenza    Acute COPD exacerbation with hypoxemia triggered by Influenza Patient presented with 2 days of worsening SOB, fever, cough, myalgias. Known history of COPD/asthma with hospitalizations in the past (denies intubation). He declined daily suppressive therapy saying he doesn't like taking medicines daily, despite being counseled by pulmonologist that he needs it. Found to be hypoxic to 81% on RA on arrival to the ED. Patient tachycardic to 120s, tachypneic. BP WNL. Labs remarkable only for mild hyponatremia 134, normal white count but diff with 91% neutrophil predominance. Troponins negative. CXR without significant consolidation, no pulmonary edema, hyperinflation noted.  Patient tested positive for H1N1 and Influenza A. Presentation most consistent with COPD exacerbation triggered by influenza. Given IV solumedrol, scheduled duonebs, and supplemental O2. His symptoms greatly improved and he was transitioned to PO solumedrol. He was started on symbicort and counseled about daily therapy for COPD. On the day of discharge, his wheezing and SOB had greatly improved and he was dc'd with symbicort, PO prednisone taper, cough syrup, guaifenesin and doxycycline.  Acute Influenza Patient + for H1N1 and Influenza A during this admission. Likely a contributing factor to his admission. He was started on tamiflu before the test results came back and was given a Rx for this on discharge to  complete his course.  Chronic Sinusitis Patient had been previously evaluated by ENT specialist who recommended surgery at the time. He has chronic nasal congestion and may benefit from repeat evaluation now that he has medicare. Recommend establishing with PCP and possible referral to ENT in the future.   Discharge Vitals:  BP 112/70  Pulse 87  Temp(Src) 96.4 F (35.8 C) (Oral)  Resp 20  Ht 5\' 9"  (1.753 m)  Wt 175 lb 0.7 oz (79.4 kg)  BMI 25.84 kg/m2  SpO2 94%  Discharge Labs: No results found for this or any previous visit (from the past 24 hour(s)).  Signed: Denton Ar 11/26/2012, 11:24 AM   Time Spent on Discharge: 35 min Services Ordered on Discharge: none  Equipment Ordered on Discharge: none

## 2012-11-26 NOTE — Progress Notes (Signed)
Internal Medicine Attending  Date: 11/26/2012  Patient name: [name] Medical record number: [id number] Date of birth: [birth year] Age: 67 y.o. Gender: male  I saw and evaluated the patient. I reviewed the resident's note by Dr. Collier Bullock and I agree with the resident's findings and plans as documented in her progress note.  Mr. Billy [name] notes marked improvement in his breathing. He feels he is ready for discharge home. I agree with the housestaff's plan to discharge him home to complete his course of therapy for his influenza, continue the prednisone taper over a two-week period, continue the bronchodilators, and continue the inhaled steroids in order to decrease further admissions for COPD exacerbations. It is likely this COPD exacerbation was secondary to his acute influenza. Followup with his pulmonologist has already been arranged and he will reestablish care with his primary care provider.

## 2012-11-29 ENCOUNTER — Ambulatory Visit (INDEPENDENT_AMBULATORY_CARE_PROVIDER_SITE_OTHER): Payer: Medicare Other | Admitting: Internal Medicine

## 2012-11-29 ENCOUNTER — Encounter: Payer: Self-pay | Admitting: Internal Medicine

## 2012-11-29 VITALS — BP 132/66 | HR 83 | Temp 97.1°F | Ht 69.0 in | Wt 199.4 lb

## 2012-11-29 DIAGNOSIS — J449 Chronic obstructive pulmonary disease, unspecified: Secondary | ICD-10-CM

## 2012-11-29 DIAGNOSIS — J441 Chronic obstructive pulmonary disease with (acute) exacerbation: Secondary | ICD-10-CM

## 2012-11-29 MED ORDER — BUDESONIDE-FORMOTEROL FUMARATE 160-4.5 MCG/ACT IN AERO: 2.0000 | INHALATION_SPRAY | Freq: Two times a day (BID) | RESPIRATORY_TRACT | Status: DC

## 2012-11-29 NOTE — Progress Notes (Signed)
Subjective:     Patient ID: Billy [name], male   DOB: [birth year] .   MRN: [id number]   Brief patient profile:  66 yowm quit smoking 1990 with cr/ asthma and recurrent flares off meds due to nonadherence attributed to  insurance/cost issues   HPI 04/07/2011 Initial pulmonary office eval in EMR era p trip to ER 6/26 and placed on prednisone taper plus nebulizer and albuterol but not any maint rx,  Nasal obst symptoms predominate but has yet to see his ENT doc [name]) no purulent sputum.   >>symbicort rx   02/01/2012 Acute OV  Complains of increased SOB, tightness/pressure in chest for 2 weeks. Went to ED 4.16.13 and was given pred 60mg  and neb tx.  CXR showed NAD , was recommended to repeat for nipple markers re -left density ? Nipple shadow.  He does not have insurance or rx coverage.  Was seen ~1 year ago, dx with possible asthma , rx Symbicort.  He is self employed . No insurance will be on medicare in June this year.  Has a lot of nasal congestion , stuffiness and congestion . After took prednisone he felt much better , nasal stuffiness was much better. Feels great now . Breathing is much better. Nasal stuffiness is better.  FEV1 1.43 L , 41%, 45 ratio  rec Begin Symbicort 160/4.31mcg 2 puffs Twice daily  -brush/rinse and gargle  Nasonex 2 puffs Twice daily  - until sample is gone.  Saline nasal rinses As needed   Amoxicillin 500mg  Three times a day  For 10 days  Prednisone taper as directed.  I will call with xray results.  follow up [name]  In 2 months with PFT and As needed   04/02/2012 f/u ov/[name] cc much better p above rx, then gradually worse overall w/in 1 week of completing rx with mostly c/o nasal congestion and intermittently purulent nasal secretions. Breathing ok on symbicort. No variability daytime. rec Prednsione 10 mg Take 4 for three days 3 for three days 2 for three days 1 for three days and stop   Augmentin twice daily x 10 days  Please see patient coordinator  before you leave today  to schedule sinus CT  Contact [name] office for follow up as soon as possible - call us if needed   Please schedule a follow up visit in 3 months but call sooner if needed with pft's    07/04/2012 f/u ov/[name] cc doing ok but not taking symbicort as instructed, using saba sev times a day rec Plan A Symbicort 160 Take 2 puffs first thing in am and then another 2 puffs about 12 hours later.  Only use your albuterol (Plan B is ventolin, Plan C is Nebulizer) as a rescue medication   11/29/2012  Pulmonary ov never followed previous instructions with freq use of saba leading up to hosp:    Date of Admission: 11/24/2012 9:24 AM  Date of Discharge: 11/26/2012  Attending Physician: [name], MD  Discharge Diagnosis:  Principal Problem:  COPD with acute exacerbation  Active Problems:  Influenza     Since c/c breathing bettter but still over using saba, has gen chest discomfort anteriorly with coughing but no purulent sputum and no sob at rest.   No obvious daytime variabilty or assoc  chest tightness, subjective wheeze overt sinus or hb symptoms. No unusual exp hx or h/o childhood pna/ asthma or premature birth to his knowledge.   Sleeping ok without nocturnal  or  early am exacerbation  of respiratory  c/o's or need for noct saba. Also denies any obvious fluctuation of symptoms with weather or environmental changes or other aggravating or alleviating factors except as outlined above   ROS  At present neg for  any significant sore throat, dysphagia, dental problems ear ache,   fever, chills, sweats, unintended wt loss, pleuritic or exertional cp, hemoptysis, palpitations, orthopnea pnd or leg swelling.  Also denies presyncope, palpitations, heartburn, abdominal pain, anorexia, nausea, vomiting, diarrhea  or change in bowel or urinary habits, change in stools or urine, dysuria,hematuria,  rash, arthralgias, visual complaints, headache, numbness weakness or  ataxia or problems with walking or coordination. No noted change in mood/affect or memory.                    Objective:   Physical Exam    Wt 187 04/07/2011>>02/01/2012 196 > 04/02/2012  202 > 07/04/2012  202>  199 11/29/2012  HEENT severe bilateral turbinate edema with ? obst polyps? Severe L >R (vs mass) .  Oropharynx no thrush or excess pnd or cobblestoning.  No JVD or cervical adenopathy. Mild accessory muscle hypertrophy. Trachea midline, nl thryroid. Chest >slt distant BS w/ faint exp wheeze   Regular rate and rhythm without murmur gallop or rub or increase P2 or edema.  Abd: no hsm, nl excursion. Ext warm without cyanosis or clubbing.    pcxr 11/24/12  COPD. No acute findings.   Assessment:          Plan:

## 2012-11-29 NOTE — Patient Instructions (Addendum)
Plan A Symbicort 160 Take 2 puffs first thing in am and then another 2 puffs about 12 hours later.    Only use your albuterol (Plan B is ventolin, Plan C is Nebulizer) as a rescue medication to be used if you can't catch your breath by resting or doing a relaxed purse lip breathing pattern. The less you use it, the better it will work when you need it.   Please schedule a follow up visit in 3 months but call sooner if needed with pfts on return

## 2012-12-02 NOTE — Assessment & Plan Note (Addendum)
-   PFT's 03/16/2007 FEV1  2.29 (70%) ratio 57 with 10% resp to B2 and DLCO 86      -PFT's 07/04/2012 FEV1 1.02 (33%) 1.61 p B2 (58%) and DLCO 76%     - HFA 90% p coaching 11/29/12  DDX of  difficult airways managment all start with A and  include Adherence, Ace Inhibitors, Acid Reflux, Active Sinus Disease, Alpha 1 Antitripsin deficiency, Anxiety masquerading as Airways dz,  ABPA,  allergy(esp in young), Aspiration (esp in elderly), Adverse effects of DPI,  Active smokers, plus two Bs  = Bronchiectasis and Beta blocker use..and one C= CHF The proper method of use, as well as anticipated side effects, of a metered-dose inhaler are discussed and demonstrated to the patient. Improved effectiveness after extensive coaching during this visit to a level of approximately  90%   ?Active sinus dz > f/u ent planned  ? Anxiety or pysch issues interfering in his rx > dx of exclusion but not clear to me why he's not following recs and ending up in [facility]    Each maintenance medication was reviewed in detail including most importantly the difference between maintenance and as needed and under what circumstances the prns are to be used.  Please see instructions for details which were reviewed in writing and the patient given a copy.

## 2013-02-04 ENCOUNTER — Telehealth: Payer: Self-pay | Admitting: Internal Medicine

## 2013-02-04 NOTE — Telephone Encounter (Signed)
Called, spoke with pt.  Reports in Feb he had an episode of sinusitis with nasal polyps.  He thinks this is back.  C/o Swelling in his nose, nasal congestion, and sinus pressure.  States this has been going on for a few weeks but is getting worse.  Unable to get mucus out.  Denies any difficulty breathing, cough, wheezing, chest tightness, chest pain, or f/c/s.  We have scheduled pt to see TP on tomorrow, February 04, 2013 at 10:15 am.  Pt aware ok with this appt.  Pt to seek emergency care if symptoms worsen or if he starts to experience breathing difficulties.

## 2013-02-05 ENCOUNTER — Encounter: Payer: Self-pay | Admitting: Adult Health

## 2013-02-05 ENCOUNTER — Ambulatory Visit (INDEPENDENT_AMBULATORY_CARE_PROVIDER_SITE_OTHER): Payer: Medicare Other | Admitting: Adult Health

## 2013-02-05 VITALS — BP 108/66 | HR 93 | Temp 97.9°F | Ht 69.0 in | Wt 205.2 lb

## 2013-02-05 DIAGNOSIS — J339 Nasal polyp, unspecified: Secondary | ICD-10-CM

## 2013-02-05 MED ORDER — PREDNISONE 10 MG PO TABS: ORAL_TABLET | ORAL | Status: DC

## 2013-02-05 MED ORDER — METHYLPREDNISOLONE ACETATE 80 MG/ML IJ SUSP
120.0000 mg | Freq: Once | INTRAMUSCULAR | Status: AC
  Administered 2013-02-05: 120 mg via INTRAMUSCULAR

## 2013-02-05 NOTE — Addendum Note (Signed)
Addended by: Ozella Almond R on: 02/05/2013 11:16 AM   Modules accepted: Orders

## 2013-02-05 NOTE — Assessment & Plan Note (Addendum)
Severe nasal polyps enlargement bilaterally w/ nasal obstruction  Case discussed with Dr. Kriste Basque  W/ pt assessment.   Pt to referred to ENT ASAP  Start on steroid taper.  Depo Medrol 120mg  IM x 1   Plan  We are referring you to Dr. [name] ENT for your nasal polyps.  If you develop nosebleeds call our office immediately or go to ER.  Begin Prednisone taper over next week.  Please contact office for sooner follow up if symptoms do not improve or worsen or seek emergency care  Follow up [name] as planned and As needed

## 2013-02-05 NOTE — Patient Instructions (Addendum)
We are referring you to Dr. [name] ENT for your nasal polyps.  If you develop nosebleeds call our office immediately or go to ER.  Begin Prednisone taper over next week.  Please contact office for sooner follow up if symptoms do not improve or worsen or seek emergency care  Follow up [name] as planned and As needed

## 2013-02-05 NOTE — Progress Notes (Signed)
Subjective:     Patient ID: Billy [name], male   DOB: 1946-02-02 .   MRN: [id number]   Brief patient profile:  66 yowm quit smoking 1990 with cr/ asthma and recurrent flares off meds due to nonadherence attributed to  insurance/cost issues   HPI 04/07/2011 Initial pulmonary office eval in EMR era p trip to ER 6/26 and placed on prednisone taper plus nebulizer and albuterol but not any maint rx,  Nasal obst symptoms predominate but has yet to see his ENT doc [name]) no purulent sputum.   >>symbicort rx   02/01/2012 Acute OV  Complains of increased SOB, tightness/pressure in chest for 2 weeks. Went to ED 4.16.13 and was given pred 60mg  and neb tx.  CXR showed NAD , was recommended to repeat for nipple markers re -left density ? Nipple shadow.  He does not have insurance or rx coverage.  Was seen ~1 year ago, dx with possible asthma , rx Symbicort.  He is self employed . No insurance will be on medicare in June this year.  Has a lot of nasal congestion , stuffiness and congestion . After took prednisone he felt much better , nasal stuffiness was much better. Feels great now . Breathing is much better. Nasal stuffiness is better.  FEV1 1.43 L , 41%, 45 ratio  rec Begin Symbicort 160/4.73mcg 2 puffs Twice daily  -brush/rinse and gargle  Nasonex 2 puffs Twice daily  - until sample is gone.  Saline nasal rinses As needed   Amoxicillin 500mg  Three times a day  For 10 days  Prednisone taper as directed.  I will call with xray results.  follow up [name]  In 2 months with PFT and As needed   04/02/2012 f/u ov/Wert cc much better p above rx, then gradually worse overall w/in 1 week of completing rx with mostly c/o nasal congestion and intermittently purulent nasal secretions. Breathing ok on symbicort. No variability daytime. rec Prednsione 10 mg Take 4 for three days 3 for three days 2 for three days 1 for three days and stop   Augmentin twice daily x 10 days  Please see patient coordinator  before you leave today  to schedule sinus CT  Contact [name] office for follow up as soon as possible - call us if needed   Please schedule a follow up visit in 3 months but call sooner if needed with pft's    07/04/2012 f/u ov/Wert cc doing ok but not taking symbicort as instructed, using saba sev times a day rec Plan A Symbicort 160 Take 2 puffs first thing in am and then another 2 puffs about 12 hours later.  Only use your albuterol (Plan B is ventolin, Plan C is Nebulizer) as a rescue medication   11/29/2012  Pulmonary ov never followed previous instructions with freq use of saba leading up to hosp:   Date of Admission: 11/24/2012 9:24 AM  Date of Discharge: 11/26/2012  Attending Physician: [name], MD  Discharge Diagnosis:  Principal Problem:  COPD with acute exacerbation  Active Problems:  Influenza   Since c/c breathing bettter but still over using saba, has gen chest discomfort anteriorly with coughing but no purulent sputum and no sob at rest. >>no chagnes   02/05/2013 Acute OV  Complains of worsening nasal polyps x2-3 days w/ nasal and ear congestion, PND. Pt has a hx of nasal polyps previously seen years ago by ENT w/ recommendations for surgical removal - pt opted for conservative  treatment.  Says over last 3 weeks nasal congestion/polyps have been getting bigger. Over last 2 days severe total nasal congestion/obstruction to point polyps are hanging out of his nose.  No fever, nose bleeds, discolored mucus. Drippy clear drainage. No visual/speech changes.    ROS Constitutional:   No  weight loss, night sweats,  Fevers, chills, fatigue, or  lassitude.  HEENT:   No headaches,  Difficulty swallowing,  Tooth/dental problems, or  Sore throat,                No sneezing, itching, ear ache,  +nasal congestion, post nasal drip,   CV:  No chest pain,  Orthopnea, PND, swelling in lower extremities, anasarca, dizziness, palpitations, syncope.   GI  No heartburn,  indigestion, abdominal pain, nausea, vomiting, diarrhea, change in bowel habits, loss of appetite, bloody stools.   Resp: No shortness of breath with exertion or at rest.  No excess mucus, no productive cough,  No non-productive cough,  No coughing up of blood.  No change in color of mucus.  No wheezing.  No chest wall deformity  Skin: no rash or lesions.  GU: no dysuria, change in color of urine, no urgency or frequency.  No flank pain, no hematuria   MS:  No joint pain or swelling.  No decreased range of motion.  No back pain.  Psych:  No change in mood or affect. No depression or anxiety.  No memory loss.          Objective:   Physical Exam    Wt 187 04/07/2011>>02/01/2012 196 > 04/02/2012  202 > 07/04/2012  202>  199 11/29/2012  HEENT severe nasal obstruction w/ visible polyps outside of bilateral nares. Severe nasal swelling/engorgement    Oropharynx no thrush or excess pnd or cobblestoning.  No JVD or cervical adenopathy. Mild accessory muscle hypertrophy. Trachea midline, nl thryroid. Chest CTA , no wheezing   Regular rate and rhythm without murmur gallop or rub or increase P2 or edema.  Abd: no hsm, nl excursion. Ext warm without cyanosis or clubbing.    pcxr 11/24/12  COPD. No acute findings.   Assessment:          Plan:

## 2013-02-07 ENCOUNTER — Other Ambulatory Visit: Payer: Self-pay | Admitting: Otolaryngology

## 2013-02-07 DIAGNOSIS — J339 Nasal polyp, unspecified: Secondary | ICD-10-CM

## 2013-03-20 ENCOUNTER — Ambulatory Visit (INDEPENDENT_AMBULATORY_CARE_PROVIDER_SITE_OTHER): Payer: Medicare Other | Admitting: Internal Medicine

## 2013-03-20 ENCOUNTER — Other Ambulatory Visit: Payer: Self-pay | Admitting: Internal Medicine

## 2013-03-20 ENCOUNTER — Encounter: Payer: Self-pay | Admitting: Internal Medicine

## 2013-03-20 VITALS — BP 126/64 | HR 91 | Temp 97.7°F | Ht 69.5 in | Wt 203.0 lb

## 2013-03-20 DIAGNOSIS — J310 Chronic rhinitis: Secondary | ICD-10-CM

## 2013-03-20 DIAGNOSIS — J441 Chronic obstructive pulmonary disease with (acute) exacerbation: Secondary | ICD-10-CM

## 2013-03-20 DIAGNOSIS — J449 Chronic obstructive pulmonary disease, unspecified: Secondary | ICD-10-CM

## 2013-03-20 LAB — PULMONARY FUNCTION TEST

## 2013-03-20 MED ORDER — PREDNISONE 10 MG PO TABS: ORAL_TABLET | ORAL | Status: DC

## 2013-03-20 MED ORDER — ALBUTEROL SULFATE HFA 108 (90 BASE) MCG/ACT IN AERS: 2.0000 | INHALATION_SPRAY | Freq: Four times a day (QID) | RESPIRATORY_TRACT | Status: DC | PRN

## 2013-03-20 NOTE — Progress Notes (Signed)
PFT done today. 

## 2013-03-20 NOTE — Progress Notes (Signed)
Subjective:     Patient ID: Billy [name], male   DOB: August 11, 1946 .   MRN: [id number]   Brief patient profile:  66 yowm quit smoking 1990 with cr/ asthma and recurrent flares off meds due to nonadherence attributed to  insurance/cost issues   HPI 04/07/2011 Initial pulmonary office eval in EMR era p trip to ER 6/26 and placed on prednisone taper plus nebulizer and albuterol but not any maint rx,  Nasal obst symptoms predominate but has yet to see his ENT doc [name]) no purulent sputum.   >>symbicort rx   02/01/2012 Acute OV  Complains of increased SOB, tightness/pressure in chest for 2 weeks. Went to ED 4.16.13 and was given pred 60mg  and neb tx.  CXR showed NAD , was recommended to repeat for nipple markers re -left density ? Nipple shadow.  He does not have insurance or rx coverage.  Was seen ~1 year ago, dx with possible asthma , rx Symbicort.  He is self employed . No insurance will be on medicare in June this year.  Has a lot of nasal congestion , stuffiness and congestion . After took prednisone he felt much better , nasal stuffiness was much better. Feels great now . Breathing is much better. Nasal stuffiness is better.  FEV1 1.43 L , 41%, 45 ratio  rec Begin Symbicort 160/4.39mcg 2 puffs Twice daily  -brush/rinse and gargle  Nasonex 2 puffs Twice daily  - until sample is gone.  Saline nasal rinses As needed   Amoxicillin 500mg  Three times a day  For 10 days  Prednisone taper as directed.  I will call with xray results.  follow up [name]  In 2 months with PFT and As needed   04/02/2012 f/u ov/Wert cc much better p above rx, then gradually worse overall w/in 1 week of completing rx with mostly c/o nasal congestion and intermittently purulent nasal secretions. Breathing ok on symbicort. No variability daytime. rec Prednsione 10 mg Take 4 for three days 3 for three days 2 for three days 1 for three days and stop   Augmentin twice daily x 10 days  Please see patient coordinator  before you leave today  to schedule sinus CT  Contact [name] office for follow up as soon as possible - call us if needed   Please schedule a follow up visit in 3 months but call sooner if needed with pft's    07/04/2012 f/u ov/Wert cc doing ok but not taking symbicort as instructed, using saba sev times a day rec Plan A Symbicort 160 Take 2 puffs first thing in am and then another 2 puffs about 12 hours later.  Only use your albuterol (Plan B is ventolin, Plan C is Nebulizer) as a rescue medication   11/29/2012  Pulmonary ov never followed previous instructions with freq use of saba leading up to hosp:   Date of Admission: 11/24/2012 9:24 AM  Date of Discharge: 11/26/2012  Attending Physician: [name], MD  Discharge Diagnosis:  Principal Problem:  COPD with acute exacerbation  Active Problems:  Influenza   Since c/c breathing bettter but still over using saba, has gen chest discomfort anteriorly with coughing but no purulent sputum and no sob at rest. >>no chagnes   02/05/2013 Acute OV  Complains of worsening nasal polyps x2-3 days w/ nasal and ear congestion, PND. Pt has a hx of nasal polyps previously seen years ago by ENT w/ recommendations for surgical removal - pt opted for conservative  treatment.  Says over last 3 weeks nasal congestion/polyps have been getting bigger. Over last 2 days severe total nasal congestion/obstruction to point polyps are hanging out of his nose.  No fever, nose bleeds, discolored mucus. Drippy clear drainage. No visual/speech changes. rec pred dose pack/ dr [name]  03/20/2013 f/u ov/Wert re chronic asthma Chief Complaint  Patient presents with  . Followup with PFT    Pt states breathing has improved since the last visit. No new co's today.    still with nasal obstruction,  But breathing much better with min use of saba  No obvious daytime variabilty or assoc chronic cough or cp or chest tightness, subjective wheeze overt sinus or hb  symptoms. No unusual exp hx or h/o childhood pna/ asthma or knowledge of premature birth.  Sleeping ok without nocturnal  or early am exacerbation  of respiratory  c/o's or need for noct saba. Also denies any obvious fluctuation of symptoms with weather or environmental changes or other aggravating or alleviating factors except as outlined above   Current Medications, Allergies, Past Medical History, Past Surgical History, Family History, and Social History were reviewed in [employer] record.  ROS  The following are not active complaints unless bolded sore throat, dysphagia, dental problems, itching, sneezing,  nasal congestion or excess/ purulent secretions, ear ache,   fever, chills, sweats, unintended wt loss, pleuritic or exertional cp, hemoptysis,  orthopnea pnd or leg swelling, presyncope, palpitations, heartburn, abdominal pain, anorexia, nausea, vomiting, diarrhea  or change in bowel or urinary habits, change in stools or urine, dysuria,hematuria,  rash, arthralgias, visual complaints, headache, numbness weakness or ataxia or problems with walking or coordination,  change in mood/affect or memory.                 Objective:   Physical Exam  amb wm who can't breath even out through his nose   Wt 187 04/07/2011>>02/01/2012 196 > 04/02/2012  202 > 07/04/2012  202>  199 11/29/2012 > 203 03/20/2013  HEENT severe nasal obstruction w/  No visible polyps  But severe non specific edema bilaterally.   Oropharynx no thrush or excess pnd or cobblestoning.  No JVD or cervical adenopathy. Mild accessory muscle hypertrophy. Trachea midline, nl thryroid. Chest CTA , no wheezing   Regular rate and rhythm without murmur gallop or rub or increase P2 or edema.  Abd: no hsm, nl excursion. Ext warm without cyanosis or clubbing.    pcxr 11/24/12  COPD. No acute findings.   Assessment:          Plan:

## 2013-03-20 NOTE — Patient Instructions (Addendum)
Plan A Symbicort 160 Take 2 puffs first thing in am and then another 2 puffs about 12 hours later.   Prednisone 10 mg take  4 each am x 2 days,   2 each am x 2 days,  1 each am x 2 days and stop    Only use your albuterol (Plan B is proaire, Plan C is Nebulizer) as a rescue medication to be used if you can't catch your breath by resting or doing a relaxed purse lip breathing pattern. The less you use it, the better it will work when you need it. Ok to use up to every 4 hours but if needing the nebulizer you need to make appt to see Tammy NP or me ASAP  Please schedule a follow up visit in 3 months but call sooner if needed

## 2013-03-21 NOTE — Assessment & Plan Note (Signed)
Prednisone 10 mg take  4 each am x 2 days,   2 each am x 2 days,  1 each am x 2 days and stop > max rx per Dr [name] topically    Each maintenance medication was reviewed in detail including most importantly the difference between maintenance and as needed and under what circumstances the prns are to be used.  Please see instructions for details which were reviewed in writing and the patient given a copy.

## 2013-03-21 NOTE — Assessment & Plan Note (Signed)
-   PFT's 03/16/2007 FEV1  2.29 (70%) ratio 57 with 10% resp to B2 and DLCO 86      -PFT's 07/04/2012 FEV1 1.02 (33%) 1.61 p B2 (58%) and DLCO 76%      - PFT's 03/20/2013  FEV 1.96 (58%) and no better p B2 and DLCO 81%      - HFA 90% 03/20/2013   He has such dramatic reversibility assoc with nasal polyposis that he is better characterized and treated as severe chronic asthma.  The proper method of use, as well as anticipated side effects, of a metered-dose inhaler are discussed and demonstrated to the patient. Improved effectiveness after extensive coaching during this visit to a level of approximately  90% so continue max labaics

## 2013-04-17 ENCOUNTER — Encounter: Payer: Self-pay | Admitting: Internal Medicine

## 2013-07-11 ENCOUNTER — Ambulatory Visit (INDEPENDENT_AMBULATORY_CARE_PROVIDER_SITE_OTHER): Payer: Medicare Other | Admitting: Internal Medicine

## 2013-07-11 ENCOUNTER — Encounter: Payer: Self-pay | Admitting: Internal Medicine

## 2013-07-11 VITALS — BP 124/80 | HR 80 | Temp 97.6°F | Ht 69.0 in | Wt 204.4 lb

## 2013-07-11 DIAGNOSIS — Z23 Encounter for immunization: Secondary | ICD-10-CM

## 2013-07-11 DIAGNOSIS — J45909 Unspecified asthma, uncomplicated: Secondary | ICD-10-CM

## 2013-07-11 DIAGNOSIS — J4550 Severe persistent asthma, uncomplicated: Secondary | ICD-10-CM

## 2013-07-11 DIAGNOSIS — J339 Nasal polyp, unspecified: Secondary | ICD-10-CM

## 2013-07-11 MED ORDER — ALBUTEROL SULFATE HFA 108 (90 BASE) MCG/ACT IN AERS: 2.0000 | INHALATION_SPRAY | Freq: Four times a day (QID) | RESPIRATORY_TRACT | Status: DC | PRN

## 2013-07-11 MED ORDER — PREDNISONE (PAK) 10 MG PO TABS: ORAL_TABLET | ORAL | Status: DC

## 2013-07-11 MED ORDER — AMOXICILLIN-POT CLAVULANATE 875-125 MG PO TABS: 1.0000 | ORAL_TABLET | Freq: Two times a day (BID) | ORAL | Status: DC

## 2013-07-11 NOTE — Progress Notes (Signed)
Subjective:     Patient ID: Billy [name], male   DOB: 02-10-1946 .   MRN: [id number]   Brief patient profile:  67  yowm quit smoking 1990 with cr/ asthma and recurrent flares off meds due to nonadherence attributed to  insurance/cost issues   HPI 04/07/2011 Initial pulmonary office eval in EMR era p trip to ER 6/26 and placed on prednisone taper plus nebulizer and albuterol but not any maint rx,  Nasal obst symptoms predominate but has yet to see his ENT doc [name]) no purulent sputum.   >>symbicort rx   02/01/2012 Acute OV  Complains of increased SOB, tightness/pressure in chest for 2 weeks. Went to ED 4.16.13 and was given pred 60mg  and neb tx.  CXR showed NAD , was recommended to repeat for nipple markers re -left density ? Nipple shadow.  He does not have insurance or rx coverage.  Was seen ~1 year ago, dx with possible asthma , rx Symbicort.  He is self employed . No insurance will be on medicare in June this year.  Has a lot of nasal congestion , stuffiness and congestion . After took prednisone he felt much better , nasal stuffiness was much better. Feels great now . Breathing is much better. Nasal stuffiness is better.  FEV1 1.43 L , 41%, 45 ratio  rec Begin Symbicort 160/4.74mcg 2 puffs Twice daily  -brush/rinse and gargle  Nasonex 2 puffs Twice daily  - until sample is gone.  Saline nasal rinses As needed   Amoxicillin 500mg  Three times a day  For 10 days  Prednisone taper as directed.  I will call with xray results.  follow up [name]  In 2 months with PFT and As needed   04/02/2012 f/u ov/[name] cc much better p above rx, then gradually worse overall w/in 1 week of completing rx with mostly c/o nasal congestion and intermittently purulent nasal secretions. Breathing ok on symbicort. No variability daytime. rec Prednsione 10 mg Take 4 for three days 3 for three days 2 for three days 1 for three days and stop   Augmentin twice daily x 10 days  Please see patient coordinator  before you leave today  to schedule sinus CT  Contact [name] office for follow up as soon as possible - call us if needed   Please schedule a follow up visit in 3 months but call sooner if needed with pft's    07/04/2012 f/u ov/[name] cc doing ok but not taking symbicort as instructed, using saba sev times a day rec Plan A Symbicort 160 Take 2 puffs first thing in am and then another 2 puffs about 12 hours later.  Only use your albuterol (Plan B is ventolin, Plan C is Nebulizer) as a rescue medication   11/29/2012  Pulmonary ov never followed previous instructions with freq use of saba leading up to hosp:   Date of Admission: 11/24/2012 9:24 AM  Date of Discharge: 11/26/2012  Attending Physician: [name], MD  Discharge Diagnosis:  Principal Problem:  COPD with acute exacerbation  Active Problems:  Influenza   Since c/c breathing bettter but still over using saba, has gen chest discomfort anteriorly with coughing but no purulent sputum and no sob at rest. >>no chagnes   02/05/2013 Acute OV  Complains of worsening nasal polyps x2-3 days w/ nasal and ear congestion, PND. Pt has a hx of nasal polyps previously seen years ago by ENT w/ recommendations for surgical removal - pt opted for  conservative treatment.  Says over last 3 weeks nasal congestion/polyps have been getting bigger. Over last 2 days severe total nasal congestion/obstruction to point polyps are hanging out of his nose.  No fever, nose bleeds, discolored mucus. Drippy clear drainage. No visual/speech changes. rec pred dose pack/ dr [name]  03/20/2013 f/u ov/[name] re chronic asthma Chief Complaint  Patient presents with  . Followup with PFT    Pt states breathing has improved since the last visit. No new co's today.    still with nasal obstruction,  But breathing much better with min use of saba rec Plan A Symbicort 160 Take 2 puffs first thing in am and then another 2 puffs about 12 hours later.  Prednisone  10 mg take  4 each am x 2 days,   2 each am x 2 days,  1 each am x 2 days and stop  Only use your albuterol (Plan B is proaire, Plan C is Nebulizer)   07/11/2013 f/u ov/[name] re: asthma/nasal polyps still can't breath through nose  Chief Complaint  Patient presents with  . Follow-up    Pt states that his breathing is doing well. He c/o having nasal congestion due to nasal polyps.     Not using any saba at all while maint on symbicort 160 2 bid    No obvious daytime variabilty or assoc chronic cough or cp or chest tightness, subjective wheeze overt sinus or hb symptoms. No unusual exp hx or h/o childhood pna/ asthma or knowledge of premature birth.  Sleeping ok without nocturnal  or early am exacerbation  of respiratory  c/o's or need for noct saba. Also denies any obvious fluctuation of symptoms with weather or environmental changes or other aggravating or alleviating factors except as outlined above   Current Medications, Allergies, Past Medical History, Past Surgical History, Family History, and Social History were reviewed in [employer] record.  ROS  The following are not active complaints unless bolded sore throat, dysphagia, dental problems, itching, sneezing,  nasal congestion or excess/ purulent secretions, ear ache,   fever, chills, sweats, unintended wt loss, pleuritic or exertional cp, hemoptysis,  orthopnea pnd or leg swelling, presyncope, palpitations, heartburn, abdominal pain, anorexia, nausea, vomiting, diarrhea  or change in bowel or urinary habits, change in stools or urine, dysuria,hematuria,  rash, arthralgias, visual complaints, headache, numbness weakness or ataxia or problems with walking or coordination,  change in mood/affect or memory.                 Objective:   Physical Exam  amb wm who can't breath even out through his nose   Wt 187 04/07/2011>>02/01/2012 196 > 04/02/2012  202 > 07/04/2012  202>  199 11/29/2012 > 203 03/20/2013 >  07/11/2013 204  HEENT severe nasal obstruction w/  No visible polyps but no air passing thru nares at all with severe non specific edema bilaterally.   Oropharynx no thrush or excess pnd or cobblestoning.  No JVD or cervical adenopathy. Mild accessory muscle hypertrophy. Trachea midline, nl thryroid. Chest CTA , no wheezing   Regular rate and rhythm without murmur gallop or rub or increase P2 or edema.  Abd: no hsm, nl excursion. Ext warm without cyanosis or clubbing.    pcxr 11/24/12  COPD. No acute findings.   Assessment:

## 2013-07-11 NOTE — Patient Instructions (Addendum)
See Dr [name] asap to get your nasal obstruction addressed  Plan A Symbicort 160 Take 2 puffs first thing in am and then another 2 puffs about 12 hours later.   Prednisone 10 mg take  4 each am x 2 days,   2 each am x 2 days,  1 each am x 2 days and stop    Only use your albuterol (Plan B is proaire, Plan C is Nebulizer) as a rescue medication to be used if you can't catch your breath by resting or doing a relaxed purse lip breathing pattern. The less you use it, the better it will work when you need it. Ok to use up to every 4 hours but if needing the nebulizer you need to make appt to see Tammy NP or me ASAP  Please schedule a follow up visit in 3 months but call sooner if needed

## 2013-07-12 MED ORDER — BUDESONIDE-FORMOTEROL FUMARATE 160-4.5 MCG/ACT IN AERO: 2.0000 | INHALATION_SPRAY | Freq: Two times a day (BID) | RESPIRATORY_TRACT | Status: DC

## 2013-07-12 NOTE — Assessment & Plan Note (Signed)
-   Sinus CT    04/02/2012 >>Progressive opacification of the nasal cavities, likely representing nasal polyposis.  Persistent pansinusitis  I had an extended discussion with the patient today lasting 15 to 20 minutes of a 25 minute visit on the following issues:   He simply cannot expect to control his symptoms with symbicort and freq cycles of pred/augmentin (requested again today) and keep putting off the surgery he desperately needs - moving no air at all through his nose and risk for refractory sinusitis is a major concern as well  Referred back to Dr [name] for consideration for surgery asap.

## 2013-07-12 NOTE — Assessment & Plan Note (Signed)
-   PFT's 03/16/2007 FEV1  2.29 (70%) ratio 57 with 10% resp to B2 and DLCO 86      -PFT's 07/04/2012 FEV1 1.02 (33%) 1.61 p B2 (58%) and DLCO 76%      - PFT's 03/20/2013  FEV 1.96 (58%) and no better p B2 and DLCO 81%      - HFA 90% 03/20/2013   All goals of chronic asthma control met including optimal function and elimination of symptoms with minimal need for rescue therapy.  Contingencies discussed in full including contacting this office immediately if not controlling the symptoms using the rule of two's.

## 2013-10-28 ENCOUNTER — Encounter: Payer: Self-pay | Admitting: Internal Medicine

## 2013-10-28 ENCOUNTER — Ambulatory Visit (INDEPENDENT_AMBULATORY_CARE_PROVIDER_SITE_OTHER): Payer: Medicare Other | Admitting: Internal Medicine

## 2013-10-28 VITALS — BP 134/82 | HR 80 | Temp 98.0°F | Ht 70.0 in | Wt 210.0 lb

## 2013-10-28 DIAGNOSIS — J45909 Unspecified asthma, uncomplicated: Secondary | ICD-10-CM

## 2013-10-28 DIAGNOSIS — J4550 Severe persistent asthma, uncomplicated: Secondary | ICD-10-CM

## 2013-10-28 MED ORDER — BUDESONIDE-FORMOTEROL FUMARATE 160-4.5 MCG/ACT IN AERO: 2.0000 | INHALATION_SPRAY | Freq: Two times a day (BID) | RESPIRATORY_TRACT | Status: DC

## 2013-10-28 NOTE — Progress Notes (Signed)
Subjective:     Patient ID: Billy [name], male   DOB: Sep 23, 1946 .   MRN: [id number]   Brief patient profile:  67  yowm quit smoking 1990 with cr/ asthma and recurrent flares off meds due to nonadherence attributed to  insurance/cost issues and being "thick headed" (pt's description)   HPI 04/07/2011 Initial pulmonary office eval in EMR era p trip to ER 6/26 and placed on prednisone taper plus nebulizer and albuterol but not any maint rx,  Nasal obst symptoms predominate but has yet to see his ENT doc [name]) no purulent sputum.   >>symbicort rx   02/01/2012 Acute OV  Complains of increased SOB, tightness/pressure in chest for 2 weeks. Went to ED 4.16.13 and was given pred 60mg  and neb tx.  CXR showed NAD , was recommended to repeat for nipple markers re -left density ? Nipple shadow.  He does not have insurance or rx coverage.  Was seen ~1 year ago, dx with possible asthma , rx Symbicort.  He is self employed . No insurance will be on medicare in June this year.  Has a lot of nasal congestion , stuffiness and congestion . After took prednisone he felt much better , nasal stuffiness was much better. Feels great now . Breathing is much better. Nasal stuffiness is better.  FEV1 1.43 L , 41%, 45 ratio  rec Begin Symbicort 160/4.69mcg 2 puffs Twice daily  -brush/rinse and gargle  Nasonex 2 puffs Twice daily  - until sample is gone.  Saline nasal rinses As needed   Amoxicillin 500mg  Three times a day  For 10 days  Prednisone taper as directed.  I will call with xray results.  follow up [name]  In 2 months with PFT and As needed   04/02/2012 f/u ov/Talyssa Gibas cc much better p above rx, then gradually worse overall w/in 1 week of completing rx with mostly c/o nasal congestion and intermittently purulent nasal secretions. Breathing ok on symbicort. No variability daytime. rec Prednsione 10 mg Take 4 for three days 3 for three days 2 for three days 1 for three days and stop  Augmentin twice daily x  10 days Please see patient coordinator before you leave today  to schedule sinus CT Contact [name] office for follow up as soon as possible - call us if needed  Please schedule a follow up visit in 3 months but call sooner if needed with pft's    07/04/2012 f/u ov/Cristol Engdahl cc doing ok but not taking symbicort as instructed, using saba sev times a day rec Plan A Symbicort 160 Take 2 puffs first thing in am and then another 2 puffs about 12 hours later.  Only use your albuterol (Plan B is ventolin, Plan C is Nebulizer) as a rescue medication   11/29/2012  Pulmonary ov never followed previous instructions with freq use of saba leading up to hosp:   Date of Admission: 11/24/2012   Date of Discharge: 11/26/2012  Attending Physician: [name], MD  Discharge Diagnosis:  Principal Problem:  COPD with acute exacerbation  Active Problems:  Influenza   Since c/c breathing bettter but still over using saba, has gen chest discomfort anteriorly with coughing but no purulent sputum and no sob at rest. >>no chagnes   02/05/2013 Acute OV  Complains of worsening nasal polyps x2-3 days w/ nasal and ear congestion, PND. Pt has a hx of nasal polyps previously seen years ago by ENT w/ recommendations for surgical removal - pt opted  for conservative treatment.  Says over last 3 weeks nasal congestion/polyps have been getting bigger. Over last 2 days severe total nasal congestion/obstruction to point polyps are hanging out of his nose.  No fever, nose bleeds, discolored mucus. Drippy clear drainage. No visual/speech changes. rec pred dose pack/ dr [name]  03/20/2013 f/u ov/Demone Lyles re chronic asthma Chief Complaint  Patient presents with  . Followup with PFT    Pt states breathing has improved since the last visit. No new co's today.    still with nasal obstruction,  But breathing much better with min use of saba rec Plan A Symbicort 160 Take 2 puffs first thing in am and then another 2 puffs about  12 hours later.  Prednisone 10 mg take  4 each am x 2 days,   2 each am x 2 days,  1 each am x 2 days and stop  Only use your albuterol (Plan B is proaire, Plan C is Nebulizer)   07/11/2013 f/u ov/Kohan Azizi re: asthma/nasal polyps still can't breath through nose  Chief Complaint  Patient presents with  . Follow-up    Pt states that his breathing is doing well. He c/o having nasal congestion due to nasal polyps.    Not using any saba at all while maint on symbicort 160 2 bid  rec See Dr [name] asap to get your nasal obstruction addressed Plan A Symbicort 160 Take 2 puffs first thing in am and then another 2 puffs about 12 hours later.  Prednisone 10 mg take  4 each am x 2 days,   2 each am x 2 days,  1 each am x 2 days and stop  Only use your albuterol (Plan B is proaire, Plan C is Nebulizer)    10/28/2013 f/u ov/Larue Drawdy re: chronic asthma/ non-adherent with symbicort / requesting saba samples Chief Complaint  Patient presents with  . Follow-up    Pt states his breathing is doing well. Has not had to use rescue inhaler since his last visit. No new co's today.   Says Only used the saba once 10/25/13 but "could I have another sample"  - stopped symbicort months prior to OV  And did ok until the last week or so prior to OV  With increasing nasal congestion and still has not scheduled surgery per Dr [name]. No purulent secrtions. Not limited from desired activities by sob    No obvious daytime variabilty or assoc chronic cough or cp or chest tightness, subjective wheeze overt   hb symptoms. No unusual exp hx or h/o childhood pna/ asthma or knowledge of premature birth.  Sleeping ok without nocturnal  or early am exacerbation  of respiratory  c/o's or need for noct saba. Also denies any obvious fluctuation of symptoms with weather or environmental changes or other aggravating or alleviating factors except as outlined above   Current Medications, Allergies, Past Medical History, Past Surgical  History, Family History, and Social History were reviewed in [employer] record.  ROS  The following are not active complaints unless bolded sore throat, dysphagia, dental problems, itching, sneezing,  nasal congestion or excess/ purulent secretions, ear ache,   fever, chills, sweats, unintended wt loss, pleuritic or exertional cp, hemoptysis,  orthopnea pnd or leg swelling, presyncope, palpitations, heartburn, abdominal pain, anorexia, nausea, vomiting, diarrhea  or change in bowel or urinary habits, change in stools or urine, dysuria,hematuria,  rash, arthralgias, visual complaints, headache, numbness weakness or ataxia or problems with walking or coordination,  change in mood/affect or memory.                 Objective:   Physical Exam  amb wm who can't breath even out through his nose   Wt 187 04/07/2011>>02/01/2012 196 > 04/02/2012  202 > 07/04/2012  202>  199 11/29/2012 > 203 03/20/2013 > 07/11/2013 204 > 10/28/2013   HEENT severe nasal obstruction w/  No visible polyps but no air passing thru nares at all with severe non specific edema bilaterally.   Oropharynx no thrush or excess pnd or cobblestoning.  No JVD or cervical adenopathy. Mild accessory muscle hypertrophy. Trachea midline, nl thryroid. Chest CTA ,   Bilateral mid exp wheeze.  Regular rate and rhythm without murmur gallop or rub or increase P2 or edema.  Abd: no hsm, nl excursion. Ext warm without cyanosis or clubbing.     pcxr 11/24/12  COPD. No acute findings.   Assessment:

## 2013-10-28 NOTE — Patient Instructions (Addendum)
See Dr Ezzard StandingNewman asap to get your nasal obstruction addressed and let him address what medications you need for it   Plan A = automatic =  Symbicort 160 Take 2 puffs first thing in am and then another 2 puffs about 12 hours later.   Plan B = Backup Only use your albuterol inhaler (Proair)as a rescue medication to be used if you can't catch your breath by resting or doing a relaxed purse lip breathing pattern.  - The less you use it, the better it will work when you need it. - Ok to use up to 2 puffs  every 4 hours if you must but call for immediate appointment if use goes up over your usual need - Don't leave home without it !!  (think of it like the spare tire for your car)  Plan C = only use neb if you try the proair first and it doesn't work, but call me right away for appointment asap in event you start needed the neb   Please schedule a follow up visit in 3 months but call sooner if needed

## 2013-10-28 NOTE — Assessment & Plan Note (Signed)
-   PFT's 03/16/2007 FEV1  2.29 (70%) ratio 57 with 10% resp to B2 and DLCO 86      -PFT's 07/04/2012 FEV1 1.02 (33%) 1.61 p B2 (58%) and DLCO 76%      - PFT's 03/20/2013  FEV 1.96 (58%) and no better p B2 and DLCO 81%   Clearly this is difficult to control under treated chronic asthma. DDX of  difficult airways managment all start with A and  include Adherence, Ace Inhibitors, Acid Reflux, Active Sinus Disease, Alpha 1 Antitripsin deficiency, Anxiety masquerading as Airways dz,  ABPA,  allergy(esp in young), Aspiration (esp in elderly), Adverse effects of DPI,  Active smokers, plus two Bs  = Bronchiectasis and Beta blocker use..and one C= CHF   Adherence is always the initial "prime suspect" and is a multilayered concern that requires a "trust but verify" approach in every patient - starting with knowing how to use medications, especially inhalers, correctly, keeping up with refills and understanding the fundamental difference between maintenance and prns vs those medications only taken for a very short course and then stopped and not refilled.  The proper method of use, as well as anticipated side effects, of a metered-dose inhaler are discussed and demonstrated to the patient. Improved effectiveness after extensive coaching during this visit to a level of approximately  90% > resume symbicort 160 2bid or risk asthma death  Active sinus dz > f/u with Dr Ezzard StandingNewman asap

## 2014-01-06 ENCOUNTER — Other Ambulatory Visit: Payer: Self-pay | Admitting: Otolaryngology

## 2014-01-06 DIAGNOSIS — R0981 Nasal congestion: Secondary | ICD-10-CM

## 2014-01-06 DIAGNOSIS — J339 Nasal polyp, unspecified: Secondary | ICD-10-CM

## 2014-01-09 ENCOUNTER — Ambulatory Visit
Admission: RE | Admit: 2014-01-09 | Discharge: 2014-01-09 | Disposition: A | Discharge status: Home / Self Care | Payer: Medicare Other | Source: Ambulatory Visit | Attending: Otolaryngology | Admitting: Otolaryngology

## 2014-01-09 DIAGNOSIS — J339 Nasal polyp, unspecified: Secondary | ICD-10-CM

## 2014-01-09 DIAGNOSIS — R0981 Nasal congestion: Secondary | ICD-10-CM

## 2014-02-12 IMAGING — CT CT PARANASAL SINUSES LIMITED
1 of 2 series · 15 of 22 positions shown, 19 images · non-contrast
Comparison: CT sinuses 02/04/2004.

CLINICAL DATA: Sinus tenderness.  Swelling and congestion.  Nasal
polyps.

CT LIMITED SINUSES WITHOUT CONTRAST
TECHNIQUE: Multidetector CT images of the paranasal sinuses were
obtained in a single plane without contrast.

[Series 4: ltd sinus 3.0 h30s · axial · 0.39mm/px · z∈[-73,+41]mm · 15 of 21 slices shown, 19 images]
[im 2/21  brain]
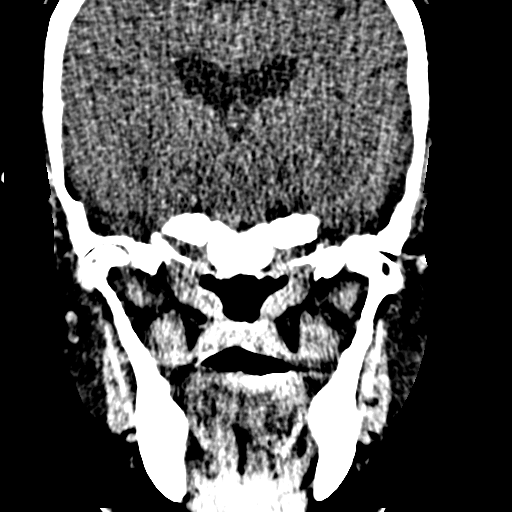
[im 2/21  bone]
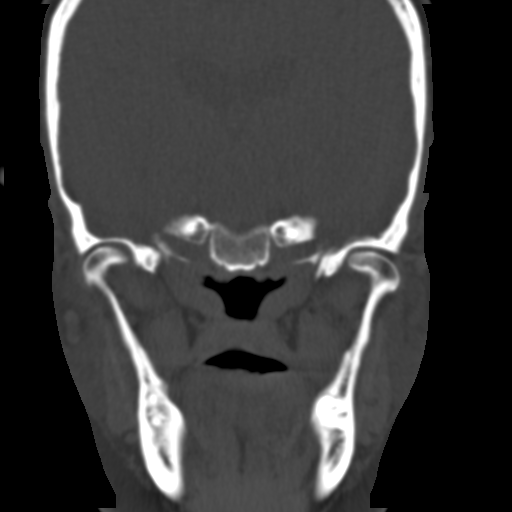
[im 3/21  bone]
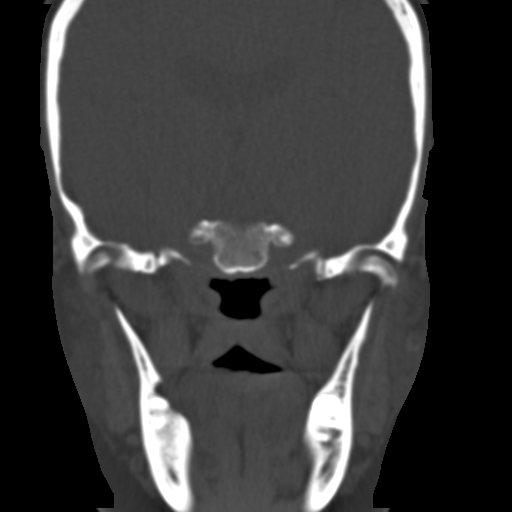
[im 5/21  bone]
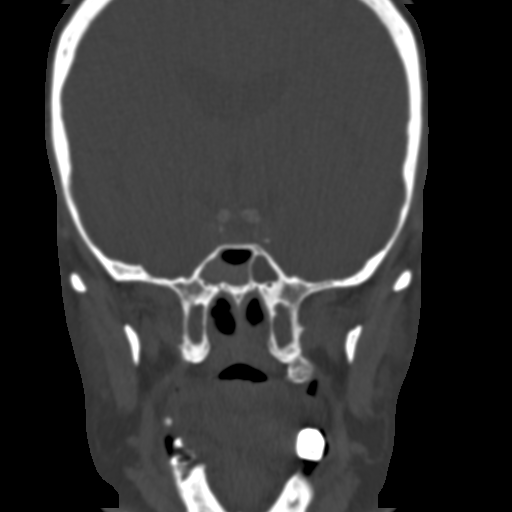
[im 6/21  bone]
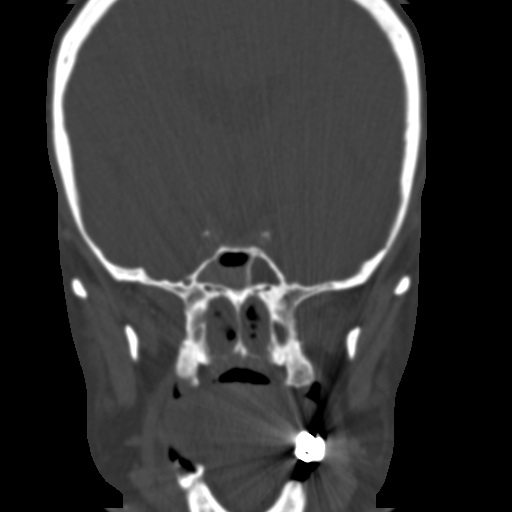
[im 7/21  brain]
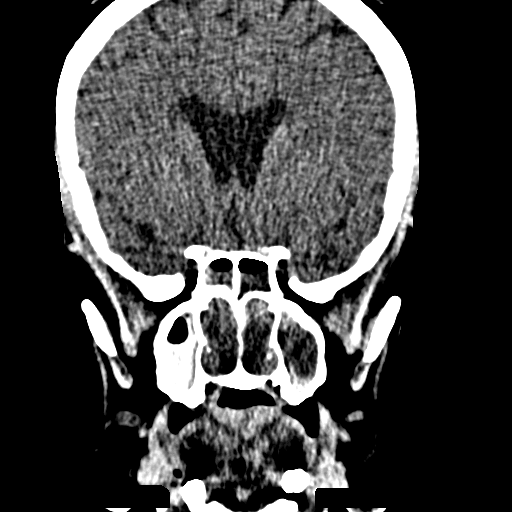
[im 7/21  bone]
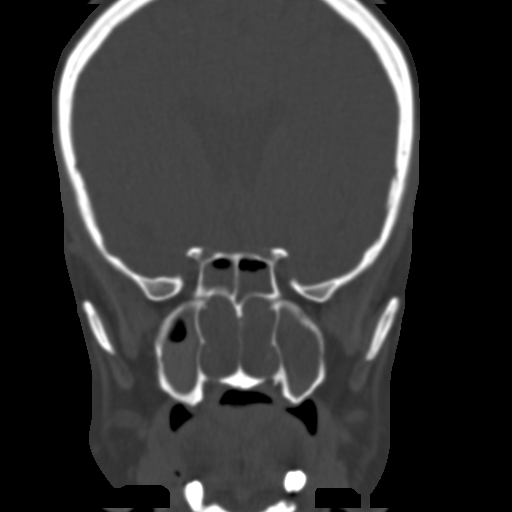
[im 8/21  bone]
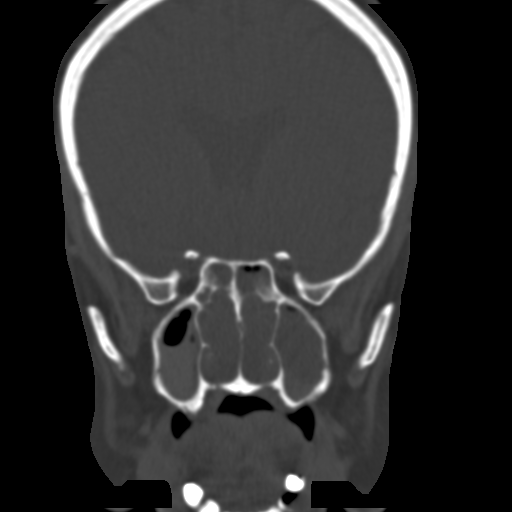
[im 10/21  bone]
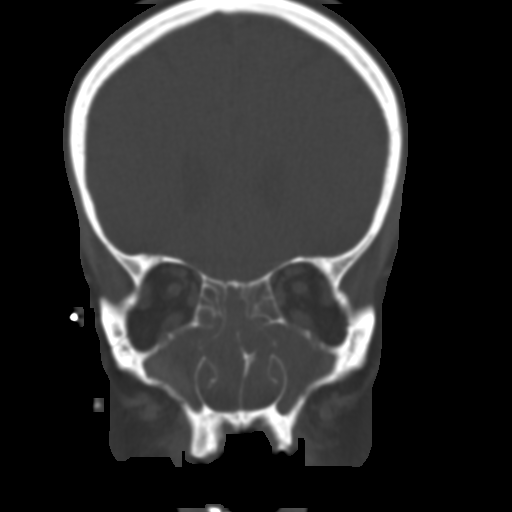
[im 11/21  bone]
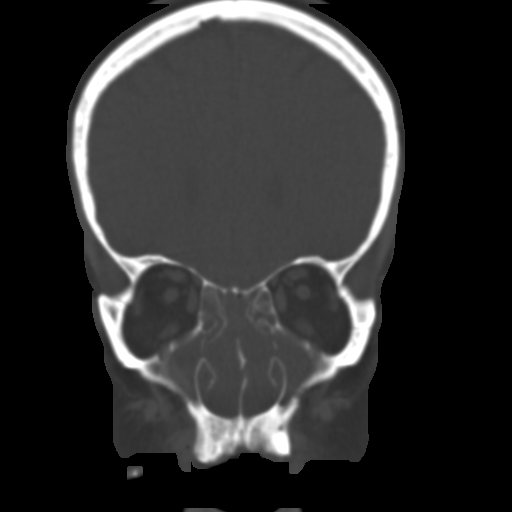
[im 12/21  brain]
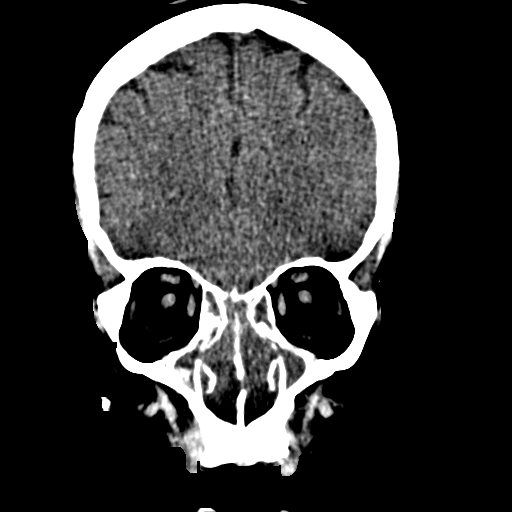
[im 12/21  bone]
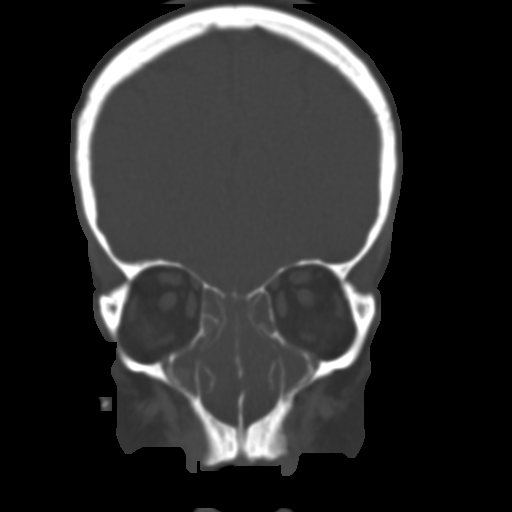
[im 14/21  bone]
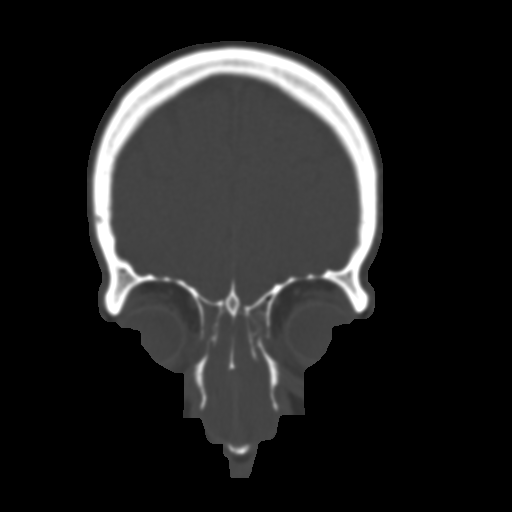
[im 15/21  bone]
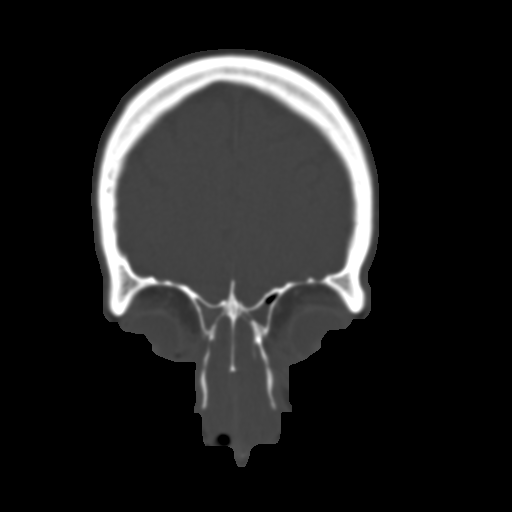
[im 16/21  bone]
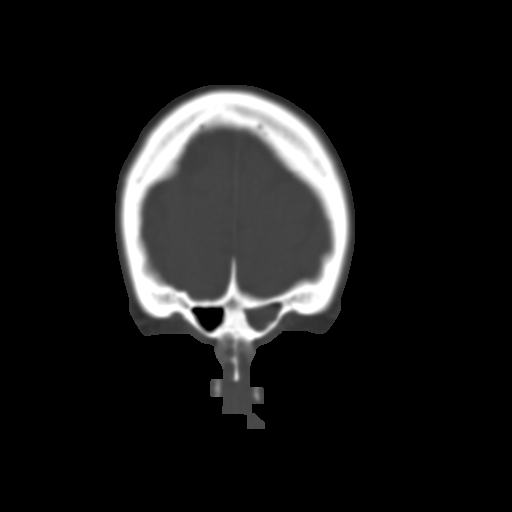
[im 17/21  brain]
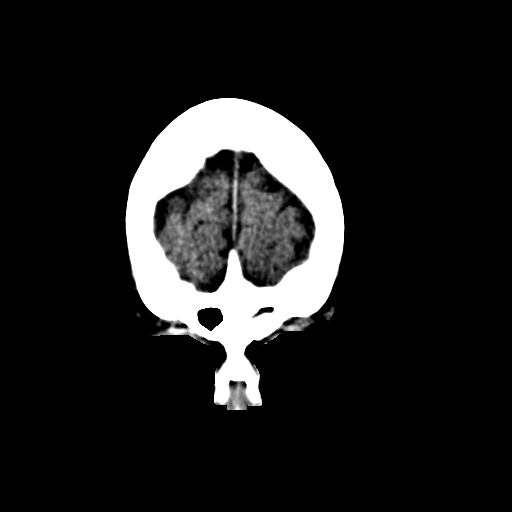
[im 17/21  bone]
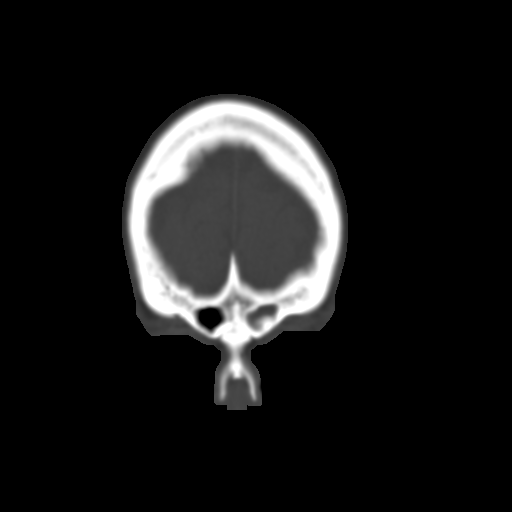
[im 19/21  bone]
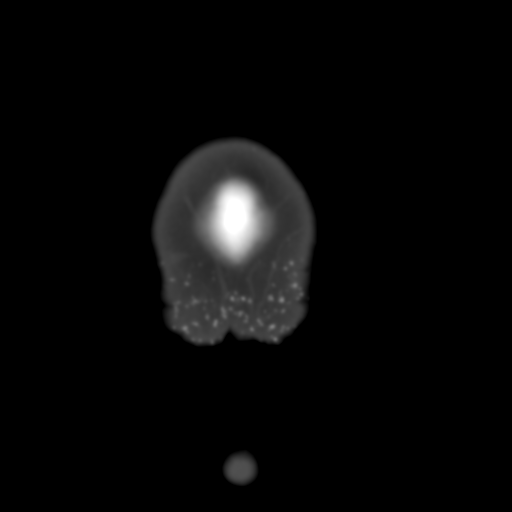
[im 20/21  bone]
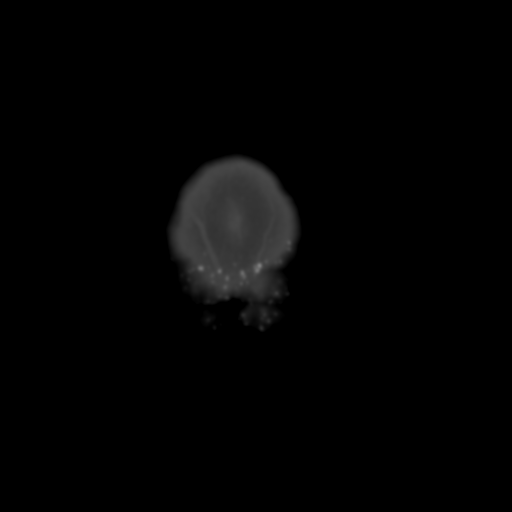

[15 of 22 positions shown; findings below may reference images not displayed]

FINDINGS: The nasal cavity is opacified.  The osseous components
of the nasal turbinates are markedly thinned, likely reflecting
osteopenia related to hyperemia.  The maxillary sinuses are near
completely opacified bilaterally.  Fluid levels are noted.
Hyperdense material is present within the maxillary sinuses
bilaterally, more prominently right than left.  The sphenoid
sinuses are near completely opacified with hyperdense material on
the right.  The ethmoid air cells are opacified.  The left frontal
sinus is opacified.  The inferior right frontal sinus is opacified.

Limited imaging of the brain is unremarkable.
IMPRESSION: 1.  Progressive opacification of the nasal cavities, likely
representing nasal polyposis.
2.  Persistent pansinusitis.  Hyperdense material in the maxillary
sinuses bilaterally, more promptly on the right and the right
sphenoid sinus likely represent inspissated secretions.

## 2014-02-24 ENCOUNTER — Other Ambulatory Visit: Payer: Self-pay | Admitting: Otolaryngology

## 2014-05-02 ENCOUNTER — Encounter (HOSPITAL_COMMUNITY): Payer: Self-pay | Admitting: Emergency Medicine

## 2014-05-02 ENCOUNTER — Observation Stay (HOSPITAL_COMMUNITY)
Admission: EM | Admit: 2014-05-02 | Discharge: 2014-05-03 | Disposition: A | Discharge status: Home / Self Care | Payer: Medicare Other | Attending: Internal Medicine | Admitting: Internal Medicine

## 2014-05-02 ENCOUNTER — Emergency Department (HOSPITAL_COMMUNITY): Payer: Medicare Other

## 2014-05-02 DIAGNOSIS — J45901 Unspecified asthma with (acute) exacerbation: Secondary | ICD-10-CM | POA: Diagnosis present

## 2014-05-02 DIAGNOSIS — Z87891 Personal history of nicotine dependence: Secondary | ICD-10-CM | POA: Diagnosis not present

## 2014-05-02 DIAGNOSIS — Z9089 Acquired absence of other organs: Secondary | ICD-10-CM | POA: Insufficient documentation

## 2014-05-02 DIAGNOSIS — Z79899 Other long term (current) drug therapy: Secondary | ICD-10-CM | POA: Insufficient documentation

## 2014-05-02 DIAGNOSIS — R0789 Other chest pain: Secondary | ICD-10-CM | POA: Diagnosis not present

## 2014-05-02 DIAGNOSIS — I498 Other specified cardiac arrhythmias: Secondary | ICD-10-CM | POA: Diagnosis not present

## 2014-05-02 DIAGNOSIS — J441 Chronic obstructive pulmonary disease with (acute) exacerbation: Principal | ICD-10-CM | POA: Insufficient documentation

## 2014-05-02 LAB — BASIC METABOLIC PANEL
Anion gap: 15 (ref 5–15)
BUN: 19 mg/dL (ref 6–23)
CALCIUM: 9.5 mg/dL (ref 8.4–10.5)
CO2: 25 meq/L (ref 19–32)
Chloride: 101 mEq/L (ref 96–112)
Creatinine, Ser: 1.28 mg/dL (ref 0.50–1.35)
GFR calc Af Amer: 65 mL/min — ABNORMAL LOW (ref >90)
GFR, EST NON AFRICAN AMERICAN: 56 mL/min — AB (ref >90)
Glucose, Bld: 119 mg/dL — ABNORMAL HIGH (ref 70–99)
Potassium: 3.8 mEq/L (ref 3.7–5.3)
Sodium: 141 mEq/L (ref 137–147)

## 2014-05-02 LAB — CREATININE, SERUM
CREATININE: 1.16 mg/dL (ref 0.50–1.35)
GFR calc Af Amer: 73 mL/min — ABNORMAL LOW (ref >90)
GFR, EST NON AFRICAN AMERICAN: 63 mL/min — AB (ref >90)

## 2014-05-02 LAB — CBC WITH DIFFERENTIAL/PLATELET
Basophils Absolute: 0 10*3/uL (ref 0.0–0.1)
Basophils Relative: 0 % (ref 0–1)
EOS ABS: 0.8 10*3/uL — AB (ref 0.0–0.7)
Eosinophils Relative: 10 % — ABNORMAL HIGH (ref 0–5)
HEMATOCRIT: 44.6 % (ref 39.0–52.0)
Hemoglobin: 14.6 g/dL (ref 13.0–17.0)
LYMPHS ABS: 1.9 10*3/uL (ref 0.7–4.0)
LYMPHS PCT: 23 % (ref 12–46)
MCH: 29.4 pg (ref 26.0–34.0)
MCHC: 32.7 g/dL (ref 30.0–36.0)
MCV: 89.7 fL (ref 78.0–100.0)
MONO ABS: 0.3 10*3/uL (ref 0.1–1.0)
Monocytes Relative: 4 % (ref 3–12)
Neutro Abs: 5.5 10*3/uL (ref 1.7–7.7)
Neutrophils Relative %: 63 % (ref 43–77)
PLATELETS: 303 10*3/uL (ref 150–400)
RBC: 4.97 MIL/uL (ref 4.22–5.81)
RDW: 12.9 % (ref 11.5–15.5)
WBC: 8.6 10*3/uL (ref 4.0–10.5)

## 2014-05-02 LAB — I-STAT TROPONIN, ED: Troponin i, poc: 0.01 ng/mL (ref 0.00–0.08)

## 2014-05-02 LAB — TROPONIN I
Troponin I: <0.3 ng/mL (ref <0.30)
Troponin I: <0.3 ng/mL (ref <0.30)

## 2014-05-02 LAB — PRO B NATRIURETIC PEPTIDE: Pro B Natriuretic peptide (BNP): 135.4 pg/mL — ABNORMAL HIGH (ref 0–125)

## 2014-05-02 MED ORDER — IPRATROPIUM-ALBUTEROL 0.5-2.5 (3) MG/3ML IN SOLN
3.0000 mL | Freq: Once | RESPIRATORY_TRACT | Status: AC
  Administered 2014-05-02: 3 mL via RESPIRATORY_TRACT
  Filled 2014-05-02: qty 3

## 2014-05-02 MED ORDER — ADULT MULTIVITAMIN W/MINERALS CH
1.0000 | ORAL_TABLET | Freq: Every day | ORAL | Status: DC
  Administered 2014-05-02 – 2014-05-03 (×2): 1 via ORAL
  Filled 2014-05-02 (×2): qty 1

## 2014-05-02 MED ORDER — BUDESONIDE-FORMOTEROL FUMARATE 160-4.5 MCG/ACT IN AERO
2.0000 | INHALATION_SPRAY | Freq: Two times a day (BID) | RESPIRATORY_TRACT | Status: DC
  Administered 2014-05-02 – 2014-05-03 (×2): 2 via RESPIRATORY_TRACT
  Filled 2014-05-02: qty 6

## 2014-05-02 MED ORDER — IPRATROPIUM BROMIDE 0.02 % IN SOLN
0.5000 mg | RESPIRATORY_TRACT | Status: DC
  Administered 2014-05-02: 0.5 mg via RESPIRATORY_TRACT
  Filled 2014-05-02: qty 2.5

## 2014-05-02 MED ORDER — ACETAMINOPHEN 325 MG PO TABS: 650.0000 mg | ORAL_TABLET | Freq: Four times a day (QID) | ORAL | Status: DC | PRN

## 2014-05-02 MED ORDER — ACETAMINOPHEN 650 MG RE SUPP: 650.0000 mg | Freq: Four times a day (QID) | RECTAL | Status: DC | PRN

## 2014-05-02 MED ORDER — IPRATROPIUM-ALBUTEROL 0.5-2.5 (3) MG/3ML IN SOLN
3.0000 mL | RESPIRATORY_TRACT | Status: DC
  Administered 2014-05-02: 3 mL via RESPIRATORY_TRACT
  Filled 2014-05-02: qty 3

## 2014-05-02 MED ORDER — METHYLPREDNISOLONE SODIUM SUCC 125 MG IJ SOLR
125.0000 mg | Freq: Once | INTRAMUSCULAR | Status: AC
  Administered 2014-05-02: 125 mg via INTRAVENOUS
  Filled 2014-05-02: qty 2

## 2014-05-02 MED ORDER — SODIUM CHLORIDE 0.9 % IJ SOLN
3.0000 mL | Freq: Two times a day (BID) | INTRAMUSCULAR | Status: DC
Start: 2014-05-02 — End: 2014-05-03
  Administered 2014-05-02 – 2014-05-03 (×2): 3 mL via INTRAVENOUS

## 2014-05-02 MED ORDER — ALBUTEROL SULFATE (2.5 MG/3ML) 0.083% IN NEBU: 2.5000 mg | INHALATION_SOLUTION | RESPIRATORY_TRACT | Status: DC

## 2014-05-02 MED ORDER — MORPHINE SULFATE 2 MG/ML IJ SOLN: 1.0000 mg | INTRAMUSCULAR | Status: DC | PRN

## 2014-05-02 MED ORDER — ENOXAPARIN SODIUM 40 MG/0.4ML ~~LOC~~ SOLN
40.0000 mg | SUBCUTANEOUS | Status: DC
  Administered 2014-05-02: 40 mg via SUBCUTANEOUS
  Filled 2014-05-02 (×2): qty 0.4

## 2014-05-02 MED ORDER — DOCUSATE SODIUM 100 MG PO CAPS
100.0000 mg | ORAL_CAPSULE | Freq: Two times a day (BID) | ORAL | Status: DC
  Administered 2014-05-02 – 2014-05-03 (×2): 100 mg via ORAL
  Filled 2014-05-02 (×3): qty 1

## 2014-05-02 MED ORDER — SODIUM CHLORIDE 0.9 % IJ SOLN: 3.0000 mL | INTRAMUSCULAR | Status: DC | PRN

## 2014-05-02 MED ORDER — ALBUTEROL SULFATE (2.5 MG/3ML) 0.083% IN NEBU
2.5000 mg | INHALATION_SOLUTION | RESPIRATORY_TRACT | Status: DC
  Administered 2014-05-02: 2.5 mg via RESPIRATORY_TRACT
  Filled 2014-05-02: qty 3

## 2014-05-02 MED ORDER — ALBUTEROL (5 MG/ML) CONTINUOUS INHALATION SOLN
10.0000 mg/h | INHALATION_SOLUTION | RESPIRATORY_TRACT | Status: DC
  Administered 2014-05-02: 10 mg/h via RESPIRATORY_TRACT

## 2014-05-02 MED ORDER — IPRATROPIUM BROMIDE 0.02 % IN SOLN: 0.5000 mg | RESPIRATORY_TRACT | Status: DC

## 2014-05-02 MED ORDER — IPRATROPIUM-ALBUTEROL 0.5-2.5 (3) MG/3ML IN SOLN
3.0000 mL | Freq: Two times a day (BID) | RESPIRATORY_TRACT | Status: DC
  Administered 2014-05-03: 3 mL via RESPIRATORY_TRACT
  Filled 2014-05-02: qty 3

## 2014-05-02 MED ORDER — SODIUM CHLORIDE 0.9 % IV SOLN: 250.0000 mL | INTRAVENOUS | Status: DC | PRN

## 2014-05-02 MED ORDER — ONDANSETRON HCL 4 MG/2ML IJ SOLN: 4.0000 mg | Freq: Four times a day (QID) | INTRAMUSCULAR | Status: DC | PRN

## 2014-05-02 MED ORDER — ALBUTEROL SULFATE (2.5 MG/3ML) 0.083% IN NEBU: 2.5000 mg | INHALATION_SOLUTION | RESPIRATORY_TRACT | Status: DC | PRN

## 2014-05-02 MED ORDER — HYDROCODONE-ACETAMINOPHEN 5-325 MG PO TABS: 1.0000 | ORAL_TABLET | ORAL | Status: DC | PRN

## 2014-05-02 MED ORDER — METHYLPREDNISOLONE SODIUM SUCC 125 MG IJ SOLR
60.0000 mg | Freq: Four times a day (QID) | INTRAMUSCULAR | Status: DC
  Administered 2014-05-02 – 2014-05-03 (×4): 60 mg via INTRAVENOUS
  Filled 2014-05-02 (×8): qty 0.96

## 2014-05-02 MED ORDER — BUDESONIDE-FORMOTEROL FUMARATE 160-4.5 MCG/ACT IN AERO
2.0000 | INHALATION_SPRAY | Freq: Two times a day (BID) | RESPIRATORY_TRACT | Status: DC
  Filled 2014-05-02: qty 6

## 2014-05-02 MED ORDER — ONDANSETRON HCL 4 MG PO TABS: 4.0000 mg | ORAL_TABLET | Freq: Four times a day (QID) | ORAL | Status: DC | PRN

## 2014-05-02 NOTE — ED Provider Notes (Signed)
Care transferred from Dr. Ranae PalmsYelverton in [phone] w/ COPD exacerbation receiving a duoneb.  Pt feeling somewhat improved. Scattered wheezing heard. Will continue treatment.   Pt remains hypoxic on RA after 2 duonebs. WIll admit to [facility]. 3rd duoneb ordered.   1. Chronic obstructive pulmonary disease with acute exacerbation      Billy CiscoMegan E Docherty, MD 05/02/14 [phone]

## 2014-05-02 NOTE — ED Notes (Signed)
EKG given to EDP,Yelverton,MD., for review. 

## 2014-05-02 NOTE — ED Notes (Signed)
RT requested to bedside for evaluation.

## 2014-05-02 NOTE — ED Notes (Signed)
Per pt report: pt woke up having difficulty breathing.  Pt reports hx of COPD.  Pt in a tripod position in triage. Pt 89% RA.

## 2014-05-02 NOTE — ED Notes (Signed)
Patient transported to X-ray 

## 2014-05-02 NOTE — ED Provider Notes (Signed)
CSN: [id number]     Arrival date & time 05/02/14  [id number] History   First MD Initiated Contact with Patient 05/02/14 0534     Chief Complaint  Patient presents with  . Shortness of Breath  . COPD     (Consider location/radiation/quality/duration/timing/severity/associated sxs/prior Treatment) HPI Patient presents with acute onset dyspnea roughly one hour before presentation. He states it feels like an exacerbation of his COPD. He denies any fevers or chills. He states he used his rescue inhaler prior to presentation with little relief. He denies any chest pain. He does complain of increased lower extremity swelling bilaterally. His shortness of breath is worse with lying flat. He's had a mild, nonproductive cough. Past Medical History  Diagnosis Date  . Intermittent asthma   . Rhinitis   . COPD (chronic obstructive pulmonary disease)    Past Surgical History  Procedure Laterality Date  . Cholecystectomy  2009   History reviewed. No pertinent family history. History  Substance Use Topics  . Smoking status: Former Smoker -- 2.00 packs/day for 15 years    Types: Cigarettes    Quit date: 10/11/1987  . Smokeless tobacco: Never Used  . Alcohol Use: No    Review of Systems  Constitutional: Negative for fever and chills.  Respiratory: Positive for cough, shortness of breath and wheezing.   Cardiovascular: Positive for leg swelling. Negative for chest pain and palpitations.  Gastrointestinal: Negative for nausea, vomiting, abdominal pain, diarrhea and constipation.  Musculoskeletal: Negative for back pain, joint swelling, neck pain and neck stiffness.  Skin: Negative for pallor, rash and wound.  Neurological: Negative for dizziness, weakness, light-headedness, numbness and headaches.  All other systems reviewed and are negative.     Allergies  Review of patient's allergies indicates no known allergies.  Home Medications   Prior to Admission medications   Medication Sig Start  Date End Date Taking? Authorizing Provider  albuterol (PROAIR HFA) 108 (90 BASE) MCG/ACT inhaler Inhale 2 puffs into the lungs every 6 (six) hours as needed for wheezing. 2 puffs every 4 hours as needed only  if your can't catch your breath 03/20/13   Nyoka CowdenMichael B Wert, MD  albuterol (PROVENTIL) (2.5 MG/3ML) 0.083% nebulizer solution Take 3 mLs (2.5 mg total) by nebulization every 4 (four) hours as needed for wheezing or shortness of breath. 11/26/12   Linward Headlandyan K Brown, MD  budesonide-formoterol [facility](SYMBICORT) 160-4.5 MCG/ACT inhaler Inhale 2 puffs into the lungs 2 (two) times daily. 10/28/13   Nyoka CowdenMichael B Wert, MD   BP 179/100  Pulse 111  SpO2 91% Physical Exam  Nursing note and vitals reviewed. Constitutional: He is oriented to person, place, and time. He appears well-developed and well-nourished.  Sitting in tripod position. Speaking in full sentences  HENT:  Head: Normocephalic and atraumatic.  Mouth/Throat: Oropharynx is clear and moist.  Eyes: EOM are normal. Pupils are equal, round, and reactive to light.  Neck: Normal range of motion. Neck supple. No JVD present.  Cardiovascular: Regular rhythm.   Tachycardia  Pulmonary/Chest: No respiratory distress. He has no wheezes. He has no rales.  Decreased breath sounds bilateral bases. Prolonged expiratory phase.  Abdominal: Soft. Bowel sounds are normal. He exhibits no distension and no mass. There is no tenderness. There is no rebound and no guarding.  Musculoskeletal: Normal range of motion. He exhibits edema. He exhibits no tenderness.  No calf swelling or tenderness.  Neurological: He is alert and oriented to person, place, and time.  Patient is alert and oriented x3 with  clear, goal oriented speech. Patient has 5/5 motor in all extremities. Sensation is intact to light touch.   Skin: Skin is warm and dry. No rash noted. No erythema.  Psychiatric: He has a normal mood and affect. His behavior is normal.    ED Course  Procedures (including  critical care time) Labs Review Labs Reviewed  CBC WITH DIFFERENTIAL - Abnormal; Notable for the following:    Eosinophils Relative 10 (*)    Eosinophils Absolute 0.8 (*)    All other components within normal limits  BASIC METABOLIC PANEL  PRO B NATRIURETIC PEPTIDE  I-STAT TROPOININ, ED    Imaging Review No results found.   EKG Interpretation   Date/Time:  Friday May 02 2014 05:56:13 EDT Ventricular Rate:  101 PR Interval:  144 QRS Duration: 95 QT Interval:  362 QTC Calculation: 469 R Axis:   86 Text Interpretation:  Sinus tachycardia Borderline right axis deviation  Minimal ST depression, inferior leads Confirmed by Ranae Palms  MD, Michayla Mcneil  ([id number]) on 05/02/2014 6:05:14 AM      MDM   Final diagnoses:  None   Patient states he is feeling much better at the initial DuoNeb. History next or wheezing more apparent on exam. We'll give hour-long nebulized treatment. The patient is continued to improve and maintaining oxygen saturations immediately to be discharged home, otherwise he'll need admission.    Loren Racer, MD 05/05/14 [phone]

## 2014-05-02 NOTE — H&P (Signed)
[facility] History and Physical  Billy NeriJames R [name] [id number] DOB: 1946/03/01 DOA: 05/02/2014  Referring physician: ED Physician.  PCP: Georgann HousekeeperHUSAIN,[name], MD   Chief Complaint: SOB, chest tightness.   HPI: Billy [name] is a 68 y.o. male with PH significant for COPD, prior history of smoking who presents to ED complaining of worsening SOB. He wake up the morning of admission with chest tightness, severe SOB, unable to breath. He relates that over last 3 weeks he has been having SOB on and off. He relates every started after his air conditioner broke. He relates dry cough. He denies chills. He is breathing after nebulizer treatments given in the ED. He is not at baseline still.    Review of Systems:  Negative, except as per HPI.   Past Medical History  Diagnosis Date  . Intermittent asthma   . Rhinitis   . COPD (chronic obstructive pulmonary disease)    Past Surgical History  Procedure Laterality Date  . Cholecystectomy  2009   Social History:  reports that he quit smoking about 26 years ago. His smoking use included Cigarettes. He has a 30 pack-year smoking history. He has never used smokeless tobacco. He reports that he does not drink alcohol or use illicit drugs.  No Known Allergies  Family History; no family history of heart diseases or COPD.   Prior to Admission medications   Medication Sig Start Date End Date Taking? Authorizing Provider  albuterol (PROAIR HFA) 108 (90 BASE) MCG/ACT inhaler Inhale 2 puffs into the lungs every 6 (six) hours as needed for wheezing. 2 puffs every 4 hours as needed only  if your can't catch your breath 03/20/13  Yes Nyoka CowdenMichael B Wert, MD  albuterol (PROVENTIL) (2.5 MG/3ML) 0.083% nebulizer solution Take 3 mLs (2.5 mg total) by nebulization every 4 (four) hours as needed for wheezing or shortness of breath. 11/26/12  Yes Linward Headlandyan K Brown, MD  budesonide-formoterol [facility](SYMBICORT) 160-4.5 MCG/ACT inhaler Inhale 2 puffs into the lungs 2 (two) times  daily. 10/28/13  Yes Nyoka CowdenMichael B Wert, MD  Ibuprofen-Diphenhydramine Cit (ADVIL PM PO) Take 1 tablet by mouth daily.   Yes Historical Provider, MD  Multiple Vitamin (MULTIVITAMIN WITH MINERALS) TABS tablet Take 1 tablet by mouth daily.   Yes Historical Provider, MD   Physical Exam: Filed Vitals:   05/02/14 1030 05/02/14 1036 05/02/14 1115 05/02/14 1130  BP: 109/63  120/60 135/77  Pulse: 87 69 94 93  Temp:    97.9 F (36.6 C)  TempSrc:    Oral  Resp: 20 22 19 17   SpO2:  93% 98% 97%    Wt Readings from Last 3 Encounters:  10/28/13 95.255 kg (210 lb)  07/11/13 92.715 kg (204 lb 6.4 oz)  03/20/13 92.08 kg (203 lb)    General:  Appears calm and comfortable, speaking full sentences.  Eyes: PERRL, normal lids, irises & conjunctiva ENT: grossly normal hearing, lips & tongue Neck: no LAD, masses or thyromegaly Cardiovascular: RRR, no m/r/g. No LE edema. Telemetry: SR, no arrhythmias  Respiratory: Normal respiratory effort. Bilateral ronchus, sporadic wheezing.  Abdomen: soft, ntnd Skin: no rash or induration seen on limited exam Musculoskeletal: grossly normal tone BUE/BLE Psychiatric: grossly normal mood and affect, speech fluent and appropriate Neurologic: grossly non-focal.          Labs on Admission:  Basic Metabolic Panel:  Recent Labs Lab 05/02/14 0540  NA 141  K 3.8  CL 101  CO2 25  GLUCOSE 119*  BUN 19  CREATININE 1.28  CALCIUM 9.5   Liver Function Tests: No results found for this basename: AST, ALT, ALKPHOS, BILITOT, PROT, ALBUMIN,  in the last 168 hours No results found for this basename: LIPASE, AMYLASE,  in the last 168 hours No results found for this basename: AMMONIA,  in the last 168 hours CBC:  Recent Labs Lab 05/02/14 0540  WBC 8.6  NEUTROABS 5.5  HGB 14.6  HCT 44.6  MCV 89.7  PLT 303   Cardiac Enzymes: No results found for this basename: CKTOTAL, CKMB, CKMBINDEX, TROPONINI,  in the last 168 hours  BNP (last 3 results)  Recent Labs   05/02/14 0537  PROBNP 135.4*   CBG: No results found for this basename: GLUCAP,  in the last 168 hours  Radiological Exams on Admission: Dg Chest 2 View  05/02/2014   CLINICAL DATA:  Shortness of breath.  EXAM: CHEST  2 VIEW  COMPARISON:  Chest radiograph November 24, 2013  FINDINGS: Cardiomediastinal silhouette is unremarkable. The lungs are clear without pleural effusions or focal consolidations. Similarly increased lung volumes with flattened hemidiaphragms. Trachea projects midline and there is no pneumothorax. Soft tissue planes and included osseous structures are non-suspicious. Mild degenerative change of the thoracic spine. Multiple EKG lines overlie the patient and may obscure subtle underlying pathology.  IMPRESSION: COPD without superimposed acute cardiopulmonary process.   Electronically Signed   By: Awilda Metro   On: 05/02/2014 06:09    EKG: Independently reviewed. Sinus tachycardia.   Assessment/Plan Active Problems:   COPD exacerbation  1-COPD exacerbation: Will start Albuterol, ipratropium, IV solumedrol.  2-Chest Tightness: probably related to COPD exacerbation but due to age will cycle cardiac enzymes.   Code Status: full code.  DVT Prophylaxis:lovenox Family Communication: discussed with patient and granddaughter at bedside.  Disposition Plan: expect less than 2 days. \      Time spent: 75 minutes.  Hartley Barefoot A [facility] Pager [phone]  **Disclaimer: This note may have been dictated with voice recognition software. Similar sounding words can inadvertently be transcribed and this note may contain transcription errors which may not have been corrected upon publication of note.**

## 2014-05-02 NOTE — ED Notes (Signed)
Initial Contact - pt resting on stretcher, A+Ox4.  Pt reports feeling much better at this time, however reports still not back to baseline.  Pt reports feeling tightness in his chest.  Pt denies CP.  Speaking full/clear sentences, rr even/un-lab.  LSCTAB, good air movement.  Occ nonprod cough noted.  Skin PWD.  MAEI.  Self repositioning for comfort.  Denies needs/complaints at this time.  Aware awaiting admission.  NAD.

## 2014-05-02 NOTE — ED Notes (Signed)
Patient is alert and oriented x3.  He is complaining of COPD exacerbation. Patient states that he started feeling short of breath about an hour ago. He adds that he is having increasing difficulty breathing

## 2014-05-03 LAB — CBC
HCT: 38.8 % — ABNORMAL LOW (ref 39.0–52.0)
Hemoglobin: 12.7 g/dL — ABNORMAL LOW (ref 13.0–17.0)
MCH: 28.9 pg (ref 26.0–34.0)
MCHC: 32.7 g/dL (ref 30.0–36.0)
MCV: 88.4 fL (ref 78.0–100.0)
PLATELETS: 311 10*3/uL (ref 150–400)
RBC: 4.39 MIL/uL (ref 4.22–5.81)
RDW: 12.8 % (ref 11.5–15.5)
WBC: 15.5 10*3/uL — ABNORMAL HIGH (ref 4.0–10.5)

## 2014-05-03 LAB — BASIC METABOLIC PANEL
Anion gap: 11 (ref 5–15)
BUN: 18 mg/dL (ref 6–23)
CALCIUM: 9.1 mg/dL (ref 8.4–10.5)
CO2: 26 mEq/L (ref 19–32)
Chloride: 103 mEq/L (ref 96–112)
Creatinine, Ser: 1.1 mg/dL (ref 0.50–1.35)
GFR calc Af Amer: 78 mL/min — ABNORMAL LOW (ref >90)
GFR calc non Af Amer: 68 mL/min — ABNORMAL LOW (ref >90)
GLUCOSE: 151 mg/dL — AB (ref 70–99)
Potassium: 4.1 mEq/L (ref 3.7–5.3)
Sodium: 140 mEq/L (ref 137–147)

## 2014-05-03 MED ORDER — ALBUTEROL SULFATE (2.5 MG/3ML) 0.083% IN NEBU: 2.5000 mg | INHALATION_SOLUTION | RESPIRATORY_TRACT | Status: AC | PRN

## 2014-05-03 MED ORDER — AZITHROMYCIN 500 MG PO TABS: 500.0000 mg | ORAL_TABLET | Freq: Every day | ORAL | Status: DC

## 2014-05-03 MED ORDER — PREDNISONE 20 MG PO TABS: ORAL_TABLET | ORAL | Status: DC

## 2014-05-03 MED ORDER — ALBUTEROL SULFATE HFA 108 (90 BASE) MCG/ACT IN AERS: 2.0000 | INHALATION_SPRAY | Freq: Four times a day (QID) | RESPIRATORY_TRACT | Status: AC | PRN

## 2014-05-03 NOTE — Discharge Summary (Signed)
Physician Discharge Summary  Billy NeriJames R Morris [id number] DOB: Jan 20, 1946 DOA: 05/02/2014  PCP: Georgann HousekeeperHUSAIN,[name], MD  Admit date: 05/02/2014 Discharge date: 05/03/2014  Time spent: 35 minutes.    Recommendations for Outpatient Follow-up:  1. Follow up with Dr Sherene SiresWert for further care of COPD. 2. Follow up with PCP for chronic medical problems. Consider ECHO.   Discharge Diagnoses:    COPD exacerbation   Chest tightness.    Discharge Condition: stable.   Diet recommendation: Heart Healthy  Filed Weights   05/02/14 1500  Weight: 95.255 kg (210 lb)    History of present illness:  Billy [name] is a 68 y.o. male with PH significant for COPD, prior history of smoking who presents to ED complaining of worsening SOB. He wake up the morning of admission with chest tightness, severe SOB, unable to breath. He relates that over last 3 weeks he has been having SOB on and off. He relates every started after his air conditioner broke. He relates dry cough. He denies chills. He is breathing after nebulizer treatments given in the ED. He is not at baseline still.    Hospital Course:  1-Acute COPD exacerbation; Patient was admitted to telemetry. He was started on solumedrol, albuterol, ipratropium he improved over period of 24 hour. He feels at baseline. He will be discharge on azithromycin, prednisone taper and will provide refill of albuterol.   2-Chest tightness; probably elated to COPD exacerbation. This has resolved. Troponin times 3 negative. Consider further cardia evaluation if this persist.    Procedures:  none  Consultations:  none  Discharge Exam: Filed Vitals:   05/03/14 0445  BP: 124/69  Pulse: 89  Temp: 97.2 F (36.2 C)  Resp: 18    General: no distress.  Cardiovascular: S 1, S 2 RRR Respiratory: CTA  Discharge Instructions You were cared for by a hospitalist during your hospital stay. If you have any questions about your discharge medications or the care you  received while you were in the hospital after you are discharged, you can call the unit and asked to speak with the hospitalist on call if the hospitalist that took care of you is not available. Once you are discharged, your primary care physician will handle any further medical issues. Please note that NO REFILLS for any discharge medications will be authorized once you are discharged, as it is imperative that you return to your primary care physician (or establish a relationship with a primary care physician if you do not have one) for your aftercare needs so that they can reassess your need for medications and monitor your lab values.  Discharge Instructions   Diet - low sodium heart healthy    Complete by:  As directed      Increase activity slowly    Complete by:  As directed             Medication List    STOP taking these medications       ADVIL PM PO      TAKE these medications       albuterol 108 (90 BASE) MCG/ACT inhaler  Commonly known as:  PROAIR HFA  Inhale 2 puffs into the lungs every 6 (six) hours as needed for wheezing. 2 puffs every 4 hours as needed only  if your can't catch your breath     albuterol (2.5 MG/3ML) 0.083% nebulizer solution  Commonly known as:  PROVENTIL  Take 3 mLs (2.5 mg total) by nebulization every 4 (four) hours as  needed for wheezing or shortness of breath.     azithromycin 500 MG tablet  Commonly known as:  ZITHROMAX  Take 1 tablet (500 mg total) by mouth daily.     budesonide-formoterol 160-4.5 MCG/ACT inhaler  Commonly known as:  SYMBICORT  Inhale 2 puffs into the lungs 2 (two) times daily.     multivitamin with minerals Tabs tablet  Take 1 tablet by mouth daily.     predniSONE 20 MG tablet  Commonly known as:  DELTASONE  Take 3 tablets for 3 days then 2 tablet for 3 days then 1 tablet for 2 days then stop.       No Known Allergies     Follow-up Information   Follow up with Georgann Housekeeper, MD. (keep your appointment)     Specialty:  Internal Medicine   Contact information:   [address] [place] [address] [phone]       Follow up with Sandrea Hughs, MD. (keep your appointment with Dr [name]. )    Specialty:  Pulmonary Disease   Contact information:   [address] [place] [address] [phone]        The results of significant diagnostics from this hospitalization (including imaging, microbiology, ancillary and laboratory) are listed below for reference.    Significant Diagnostic Studies: Dg Chest 2 View  05/02/2014   CLINICAL DATA:  Shortness of breath.  EXAM: CHEST  2 VIEW  COMPARISON:  Chest radiograph November 24, 2013  FINDINGS: Cardiomediastinal silhouette is unremarkable. The lungs are clear without pleural effusions or focal consolidations. Similarly increased lung volumes with flattened hemidiaphragms. Trachea projects midline and there is no pneumothorax. Soft tissue planes and included osseous structures are non-suspicious. Mild degenerative change of the thoracic spine. Multiple EKG lines overlie the patient and may obscure subtle underlying pathology.  IMPRESSION: COPD without superimposed acute cardiopulmonary process.   Electronically Signed   By: Awilda Metro   On: 05/02/2014 06:09    Microbiology: No results found for this or any previous visit (from the past 240 hour(s)).   Labs: Basic Metabolic Panel:  Recent Labs Lab 05/02/14 0540 05/02/14 1239 05/03/14 0435  NA 141  --  140  K 3.8  --  4.1  CL 101  --  103  CO2 25  --  26  GLUCOSE 119*  --  151*  BUN 19  --  18  CREATININE 1.28 1.16 1.10  CALCIUM 9.5  --  9.1   Liver Function Tests: No results found for this basename: AST, ALT, ALKPHOS, BILITOT, PROT, ALBUMIN,  in the last 168 hours No results found for this basename: LIPASE, AMYLASE,  in the last 168 hours No results found for this basename: AMMONIA,  in the last 168 hours CBC:  Recent Labs Lab 05/02/14 0540 05/03/14 0435   WBC 8.6 15.5*  NEUTROABS 5.5  --   HGB 14.6 12.7*  HCT 44.6 38.8*  MCV 89.7 88.4  PLT 303 311   Cardiac Enzymes:  Recent Labs Lab 05/02/14 1239 05/02/14 1842  TROPONINI <0.30 <0.30   BNP: BNP (last 3 results)  Recent Labs  05/02/14 0537  PROBNP 135.4*   CBG: No results found for this basename: GLUCAP,  in the last 168 hours     Signed:  Hartley Barefoot A  [facility] 05/03/2014, 10:22 AM

## 2014-05-05 ENCOUNTER — Encounter (HOSPITAL_COMMUNITY): Payer: Self-pay | Admitting: Emergency Medicine

## 2014-05-05 ENCOUNTER — Inpatient Hospital Stay (HOSPITAL_COMMUNITY)
Admission: EM | Admit: 2014-05-05 | Discharge: 2014-05-06 | DRG: 190 | Disposition: A | Discharge status: Home / Self Care | Payer: Medicare Other | Attending: Internal Medicine | Admitting: Internal Medicine

## 2014-05-05 ENCOUNTER — Emergency Department (HOSPITAL_COMMUNITY): Payer: Medicare Other

## 2014-05-05 DIAGNOSIS — Z91199 Patient's noncompliance with other medical treatment and regimen due to unspecified reason: Secondary | ICD-10-CM

## 2014-05-05 DIAGNOSIS — I498 Other specified cardiac arrhythmias: Secondary | ICD-10-CM | POA: Diagnosis present

## 2014-05-05 DIAGNOSIS — J9600 Acute respiratory failure, unspecified whether with hypoxia or hypercapnia: Secondary | ICD-10-CM | POA: Diagnosis present

## 2014-05-05 DIAGNOSIS — Z9119 Patient's noncompliance with other medical treatment and regimen: Secondary | ICD-10-CM

## 2014-05-05 DIAGNOSIS — Z87891 Personal history of nicotine dependence: Secondary | ICD-10-CM

## 2014-05-05 DIAGNOSIS — J449 Chronic obstructive pulmonary disease, unspecified: Secondary | ICD-10-CM | POA: Diagnosis present

## 2014-05-05 DIAGNOSIS — J441 Chronic obstructive pulmonary disease with (acute) exacerbation: Secondary | ICD-10-CM | POA: Diagnosis not present

## 2014-05-05 DIAGNOSIS — J4552 Severe persistent asthma with status asthmaticus: Secondary | ICD-10-CM

## 2014-05-05 DIAGNOSIS — J9601 Acute respiratory failure with hypoxia: Secondary | ICD-10-CM

## 2014-05-05 DIAGNOSIS — J4550 Severe persistent asthma, uncomplicated: Secondary | ICD-10-CM | POA: Diagnosis present

## 2014-05-05 DIAGNOSIS — R0602 Shortness of breath: Secondary | ICD-10-CM | POA: Diagnosis not present

## 2014-05-05 DIAGNOSIS — J45901 Unspecified asthma with (acute) exacerbation: Principal | ICD-10-CM

## 2014-05-05 LAB — I-STAT VENOUS BLOOD GAS, ED
Acid-Base Excess: 2 mmol/L (ref 0.0–2.0)
Bicarbonate: 30.3 mEq/L — ABNORMAL HIGH (ref 20.0–24.0)
O2 SAT: 66 %
PCO2 VEN: 62.7 mmHg — AB (ref 45.0–50.0)
PO2 VEN: 39 mmHg (ref 30.0–45.0)
TCO2: 32 mmol/L (ref 0–100)
pH, Ven: 7.292 (ref 7.250–7.300)

## 2014-05-05 LAB — CBC WITH DIFFERENTIAL/PLATELET
BASOS ABS: 0 10*3/uL (ref 0.0–0.1)
Basophils Relative: 0 % (ref 0–1)
EOS PCT: 1 % (ref 0–5)
Eosinophils Absolute: 0 10*3/uL (ref 0.0–0.7)
HCT: 45.3 % (ref 39.0–52.0)
Hemoglobin: 14.9 g/dL (ref 13.0–17.0)
LYMPHS ABS: 2.2 10*3/uL (ref 0.7–4.0)
LYMPHS PCT: 25 % (ref 12–46)
MCH: 29.8 pg (ref 26.0–34.0)
MCHC: 32.9 g/dL (ref 30.0–36.0)
MCV: 90.6 fL (ref 78.0–100.0)
Monocytes Absolute: 0.3 10*3/uL (ref 0.1–1.0)
Monocytes Relative: 4 % (ref 3–12)
NEUTROS ABS: 6.2 10*3/uL (ref 1.7–7.7)
NEUTROS PCT: 70 % (ref 43–77)
PLATELETS: 312 10*3/uL (ref 150–400)
RBC: 5 MIL/uL (ref 4.22–5.81)
RDW: 13.7 % (ref 11.5–15.5)
WBC: 8.8 10*3/uL (ref 4.0–10.5)

## 2014-05-05 LAB — I-STAT CHEM 8, ED
BUN: 26 mg/dL — ABNORMAL HIGH (ref 6–23)
CHLORIDE: 105 meq/L (ref 96–112)
Calcium, Ion: 1.26 mmol/L (ref 1.13–1.30)
Creatinine, Ser: 1.3 mg/dL (ref 0.50–1.35)
Glucose, Bld: 101 mg/dL — ABNORMAL HIGH (ref 70–99)
HCT: 47 % (ref 39.0–52.0)
Hemoglobin: 16 g/dL (ref 13.0–17.0)
POTASSIUM: 3.8 meq/L (ref 3.7–5.3)
SODIUM: 141 meq/L (ref 137–147)
TCO2: 26 mmol/L (ref 0–100)

## 2014-05-05 LAB — I-STAT TROPONIN, ED: Troponin i, poc: 0 ng/mL (ref 0.00–0.08)

## 2014-05-05 LAB — PRO B NATRIURETIC PEPTIDE: PRO B NATRI PEPTIDE: 397.8 pg/mL — AB (ref 0–125)

## 2014-05-05 MED ORDER — IPRATROPIUM BROMIDE 0.02 % IN SOLN
0.5000 mg | Freq: Once | RESPIRATORY_TRACT | Status: AC
  Administered 2014-05-05: 0.5 mg via RESPIRATORY_TRACT
  Filled 2014-05-05: qty 2.5

## 2014-05-05 MED ORDER — IPRATROPIUM-ALBUTEROL 0.5-2.5 (3) MG/3ML IN SOLN
3.0000 mL | Freq: Four times a day (QID) | RESPIRATORY_TRACT | Status: DC
  Administered 2014-05-05 – 2014-05-06 (×4): 3 mL via RESPIRATORY_TRACT
  Filled 2014-05-05 (×5): qty 3

## 2014-05-05 MED ORDER — ALBUTEROL SULFATE (2.5 MG/3ML) 0.083% IN NEBU
2.5000 mg | INHALATION_SOLUTION | RESPIRATORY_TRACT | Status: DC | PRN
  Administered 2014-05-06 (×2): 2.5 mg via RESPIRATORY_TRACT
  Filled 2014-05-05 (×2): qty 3

## 2014-05-05 MED ORDER — CEFTRIAXONE SODIUM 1 G IJ SOLR
1.0000 g | Freq: Once | INTRAMUSCULAR | Status: DC
  Filled 2014-05-05: qty 10

## 2014-05-05 MED ORDER — ALBUTEROL SULFATE (2.5 MG/3ML) 0.083% IN NEBU
5.0000 mg | INHALATION_SOLUTION | Freq: Once | RESPIRATORY_TRACT | Status: AC
  Administered 2014-05-05: 5 mg via RESPIRATORY_TRACT
  Filled 2014-05-05: qty 6

## 2014-05-05 MED ORDER — BUDESONIDE-FORMOTEROL FUMARATE 160-4.5 MCG/ACT IN AERO: 2.0000 | INHALATION_SPRAY | Freq: Two times a day (BID) | RESPIRATORY_TRACT | Status: DC | PRN

## 2014-05-05 MED ORDER — HYDROMORPHONE HCL PF 1 MG/ML IJ SOLN: 0.5000 mg | Freq: Once | INTRAMUSCULAR | Status: DC

## 2014-05-05 MED ORDER — CEFTRIAXONE SODIUM 1 G IJ SOLR
1.0000 g | Freq: Once | INTRAMUSCULAR | Status: DC
Start: 2014-05-05 — End: 2014-05-05

## 2014-05-05 MED ORDER — METHYLPREDNISOLONE SODIUM SUCC 125 MG IJ SOLR
60.0000 mg | Freq: Two times a day (BID) | INTRAMUSCULAR | Status: DC
  Administered 2014-05-05: 1 mg via INTRAVENOUS
  Administered 2014-05-06: 60 mg via INTRAVENOUS
  Filled 2014-05-05 (×2): qty 0.96
  Filled 2014-05-05: qty 2
  Filled 2014-05-05: qty 0.96

## 2014-05-05 MED ORDER — METHYLPREDNISOLONE SODIUM SUCC 125 MG IJ SOLR
125.0000 mg | Freq: Once | INTRAMUSCULAR | Status: AC
  Administered 2014-05-05: 125 mg via INTRAVENOUS
  Filled 2014-05-05: qty 2

## 2014-05-05 MED ORDER — ADULT MULTIVITAMIN W/MINERALS CH
1.0000 | ORAL_TABLET | Freq: Every day | ORAL | Status: DC
  Administered 2014-05-05 – 2014-05-06 (×2): 1 via ORAL
  Filled 2014-05-05 (×2): qty 1

## 2014-05-05 MED ORDER — DEXTROSE 5 % IV SOLN: 1.0000 g | Freq: Once | INTRAVENOUS | Status: AC

## 2014-05-05 MED ORDER — NITROGLYCERIN 2 % TD OINT
1.0000 [in_us] | TOPICAL_OINTMENT | Freq: Once | TRANSDERMAL | Status: AC
  Administered 2014-05-05: 1 [in_us] via TOPICAL
  Filled 2014-05-05: qty 1

## 2014-05-05 MED ORDER — IPRATROPIUM BROMIDE 0.02 % IN SOLN
RESPIRATORY_TRACT | Status: AC
  Filled 2014-05-05: qty 2.5

## 2014-05-05 MED ORDER — DEXTROSE 5 % IV SOLN
1.0000 g | Freq: Once | INTRAVENOUS | Status: AC
  Administered 2014-05-05: 1 g via INTRAVENOUS

## 2014-05-05 MED ORDER — SODIUM CHLORIDE 0.9 % IJ SOLN
3.0000 mL | Freq: Two times a day (BID) | INTRAMUSCULAR | Status: DC
  Administered 2014-05-05 (×2): 3 mL via INTRAVENOUS

## 2014-05-05 MED ORDER — LORATADINE 10 MG PO TABS
10.0000 mg | ORAL_TABLET | Freq: Every day | ORAL | Status: DC
  Administered 2014-05-05 – 2014-05-06 (×2): 10 mg via ORAL
  Filled 2014-05-05 (×2): qty 1

## 2014-05-05 MED ORDER — AZITHROMYCIN 250 MG PO TABS
250.0000 mg | ORAL_TABLET | ORAL | Status: DC
  Administered 2014-05-05 – 2014-05-06 (×2): 250 mg via ORAL
  Filled 2014-05-05 (×3): qty 1

## 2014-05-05 MED ORDER — ALBUTEROL (5 MG/ML) CONTINUOUS INHALATION SOLN
10.0000 mg/h | INHALATION_SOLUTION | Freq: Once | RESPIRATORY_TRACT | Status: AC
  Administered 2014-05-05: 10 mg/h via RESPIRATORY_TRACT
  Filled 2014-05-05: qty 20

## 2014-05-05 MED ORDER — DEXTROSE 5 % IV SOLN
500.0000 mg | INTRAVENOUS | Status: DC
  Filled 2014-05-05: qty 500

## 2014-05-05 MED ORDER — ALBUTEROL SULFATE (2.5 MG/3ML) 0.083% IN NEBU: 2.5000 mg | INHALATION_SOLUTION | Freq: Four times a day (QID) | RESPIRATORY_TRACT | Status: DC | PRN

## 2014-05-05 MED ORDER — ALBUTEROL SULFATE (2.5 MG/3ML) 0.083% IN NEBU: 2.5000 mg | INHALATION_SOLUTION | RESPIRATORY_TRACT | Status: DC | PRN

## 2014-05-05 MED ORDER — ZOLPIDEM TARTRATE 5 MG PO TABS
5.0000 mg | ORAL_TABLET | Freq: Once | ORAL | Status: AC
  Administered 2014-05-05: 5 mg via ORAL
  Filled 2014-05-05: qty 1

## 2014-05-05 MED ORDER — KETOROLAC TROMETHAMINE 30 MG/ML IJ SOLN
30.0000 mg | Freq: Once | INTRAMUSCULAR | Status: AC
  Administered 2014-05-05: 30 mg via INTRAVENOUS
  Filled 2014-05-05: qty 1

## 2014-05-05 MED ORDER — HEPARIN SODIUM (PORCINE) 5000 UNIT/ML IJ SOLN
5000.0000 [IU] | Freq: Three times a day (TID) | INTRAMUSCULAR | Status: DC
  Administered 2014-05-05 – 2014-05-06 (×4): 5000 [IU] via SUBCUTANEOUS
  Filled 2014-05-05 (×5): qty 1

## 2014-05-05 MED ORDER — ALBUTEROL SULFATE (2.5 MG/3ML) 0.083% IN NEBU
INHALATION_SOLUTION | RESPIRATORY_TRACT | Status: AC
Start: 2014-05-05 — End: 2014-05-05
  Filled 2014-05-05: qty 3

## 2014-05-05 MED ORDER — DEXTROSE 5 % IV SOLN
500.0000 mg | Freq: Once | INTRAVENOUS | Status: AC
  Administered 2014-05-05: 500 mg via INTRAVENOUS
  Filled 2014-05-05: qty 500

## 2014-05-05 MED ORDER — ALBUTEROL SULFATE HFA 108 (90 BASE) MCG/ACT IN AERS: 2.0000 | INHALATION_SPRAY | Freq: Four times a day (QID) | RESPIRATORY_TRACT | Status: DC | PRN

## 2014-05-05 NOTE — ED Notes (Signed)
No answer when attempted to call report.

## 2014-05-05 NOTE — Progress Notes (Signed)
I was called to put the patient on the BIPAP machine at this time. Patient was on a NRB. I tried to start the patient out on 10/5 and patient says its too much pressure. I went down to 8/4 per patient comfort, however patient still could not tolerate the BIPAP machine. Patient is back on the NRB, tolerating much better at this time. BIPAP is still set up at bedside if patient wants to try the machine again. RT will continue to assist as needed.

## 2014-05-05 NOTE — Progress Notes (Signed)
TEAM 1 - Stepdown/ICU TEAM Progress Note  JUANJOSE MOJICA WUJ:[id number] DOB: 1946/02/09 DOA: 05/05/2014 PCP: Georgann Housekeeper, MD  Admit HPI / Brief Narrative: 68 y.o. male who was discharged from [facility] after a hospital stay and treatment for a COPD exacerbation. On Sunday he didn\'t get his prednisone filled. Unfortunately he suffered an acute relapse of his COPD symptoms due to noncompliance with his prescribed medication regimen, with severe SOB, wheezing, some cough. He returned to the ED in frank respiratory distress.   HPI/Subjective: Pt seen for f/u visit.  Pt states he feels he was "released too soon" from [facility].  Doesn\'t seem to feel that his noncompliance with the prescribed steroid taper has anything to do with his relapse.    Assessment/Plan:  Acute recurrent COPD exacerbation  Noncompliance w/ medical therapy  Code Status: FULL Family Communication: no family present at time of exam Disposition Plan: transfer to medical bed   Consultants: none  Procedures: none  Antibiotics: azithro 7/26 >> rocephin 7/26>>  DVT prophylaxis: SQ heparin   Objective: Blood pressure 116/53, pulse 77, temperature 97.5 F (36.4 C), temperature source Oral, resp. rate 20, height 5\' 11" (1.803 m), weight 95.255 kg (210 lb), SpO2 99.00%.  Intake/Output Summary (Last 24 hours) at 05/05/14 1544 Last data filed at 05/05/14 0609  Gross per 24 hour  Intake     50 ml  Output      0 ml  Net     50  ml   Exam: F/u exam completed.  No acute resp distress at this time.  Data Reviewed: Basic Metabolic Panel:  Recent Labs Lab 05/02/14 0540 05/02/14 1239 05/03/14 0435 05/05/14 0448  NA 141  --  140 141  K 3.8  --  4.1 3.8  CL 101  --  103 105  CO2 25  --  26  --   GLUCOSE 119*  --  151* 101*  BUN 19  --  18 26*  CREATININE 1.28 1.16 1.10 1.30  CALCIUM 9.5  --  9.1  --     Liver Function Tests: No results found for this basename: AST, ALT, ALKPHOS, BILITOT, PROT, ALBUMIN,   in the last 168 hours  CBC:  Recent Labs Lab 05/02/14 0540 05/03/14 0435 05/05/14 0436 05/05/14 0448  WBC 8.6 15.5* 8.8  --   NEUTROABS 5.5  --  6.2  --   HGB 14.6 12.7* 14.9 16.0  HCT 44.6 38.8* 45.3 47.0  MCV 89.7 88.4 90.6  --   PLT 303 311 312  --     Cardiac Enzymes:  Recent Labs Lab 05/02/14 1239 05/02/14 1842  TROPONINI <0.30 <0.30   BNP (last 3 results)  Recent Labs  05/02/14 0537 05/05/14 0437  PROBNP 135.4* 397.8*    Studies:  Recent x-ray studies have been reviewed in detail by the Attending Physician  Scheduled Meds:  Scheduled Meds: . [START ON 05/06/2014] azithromycin  500 mg Intravenous Q24H  . heparin  5,000 Units Subcutaneous 3 times per day  . methylPREDNISolone (SOLU-MEDROL) injection  60 mg Intravenous Q12H  . multivitamin with minerals  1 tablet Oral Daily  . sodium chloride  3 mL Intravenous Q12H    Time spent on care of this patient: 25+ mins   MCCLUNG,JEFFREY T , MD   [facility] Office  [phone] Pager - Text Page per Loretha Stapler as per below:  On-Call/Text Page:      [email or web address]      password [email or web address]  If 7PM-7AM, please contact  night-coverage [email or web address] Password [email or web address] 05/05/2014, 3:44 PM   LOS: 0 days

## 2014-05-05 NOTE — ED Notes (Signed)
Shown lab resaults on blood gas to DR.PALUMBO

## 2014-05-05 NOTE — Progress Notes (Signed)
Billy [name]  Transfer Data: 05/05/2014 6:49 PM  Attending Provider: Lonia BloodJeffrey T McClung, MD  VQQ:VZDGLO,VFIEPPPCP:[name],[name], MD  Code Status: Full  Billy [name] is a 68 y.o. male patient transferred from 3s  -No acute distress noted.  -No complaints of shortness of breath.  -No complaints of chest pain.    Blood pressure 155/76, pulse 80, temperature 98.1 F (36.7 C), temperature source Oral, resp. rate 14, height 5\' 9"  (1.753 m), weight 92.987 kg (205 lb), SpO2 97.00%.  ?  IV Fluids: IV in place, occlusive dsg intact without redness, IV cath hand left, condition patent and no redness  none.  Allergies: Review of patient's allergies indicates no known allergies.  Past Medical History:  has a past medical history of Intermittent asthma; Rhinitis; and COPD (chronic obstructive pulmonary disease).  Past Surgical History:  has past surgical history that includes Cholecystectomy (2009).  Social History:  reports that he quit smoking about 26 years ago. His smoking use included Cigarettes. He has a 30 pack-year smoking history. He has never used smokeless tobacco. He reports that he does not drink alcohol or use illicit drugs.  Skin: intact   Patient/Family orientated to room. Information packet given to patient/family. Admission inpatient armband information verified with patient/family to include name and date of birth and placed on patient arm. Side rails up x 2, fall assessment and education completed with patient/family. Patient/family able to verbalize understanding of risk associated with falls and verbalized understanding to call for assistance before getting out of bed. Call light within reach. Patient/family able to voice and demonstrate understanding of unit orientation instructions.  Will continue to evaluate and treat per MD orders.

## 2014-05-05 NOTE — ED Notes (Signed)
Patient presents from home with SOB.  [facility] 11[facility] for the same.

## 2014-05-05 NOTE — Progress Notes (Signed)
Utilization review completed.  

## 2014-05-05 NOTE — Progress Notes (Signed)
RT cannot initiate adult wheeze protocol at this time due to the patient already receiving a 10 mg CAT. RT will continue to assist as needed.

## 2014-05-05 NOTE — ED Provider Notes (Signed)
CSN: [id number]     Arrival date & time 05/05/14  0351 History   First MD Initiated Contact with Patient 05/05/14 0400     Chief Complaint  Patient presents with  . Shortness of Breath     (Consider location/radiation/quality/duration/timing/severity/associated sxs/prior Treatment) Patient is a 68 y.o. male presenting with shortness of breath. The history is provided by the patient and the EMS personnel. The history is limited by the condition of the patient.  Shortness of Breath Severity:  Severe Onset quality:  Gradual Timing:  Constant Progression:  Worsening Chronicity:  Recurrent Context: not fumes   Relieved by:  Nothing Worsened by:  Nothing tried Ineffective treatments:  None tried Associated symptoms: wheezing   Associated symptoms: no fever   Risk factors: no recent surgery     Past Medical History  Diagnosis Date  . Intermittent asthma   . Rhinitis   . COPD (chronic obstructive pulmonary disease)    Past Surgical History  Procedure Laterality Date  . Cholecystectomy  2009   History reviewed. No pertinent family history. History  Substance Use Topics  . Smoking status: Former Smoker -- 2.00 packs/day for 15 years    Types: Cigarettes    Quit date: 10/11/1987  . Smokeless tobacco: Never Used  . Alcohol Use: No    Review of Systems  Constitutional: Negative for fever.  Respiratory: Positive for chest tightness, shortness of breath and wheezing.   Cardiovascular: Negative for leg swelling.  All other systems reviewed and are negative.     Allergies  Review of patient's allergies indicates no known allergies.  Home Medications   Prior to Admission medications   Medication Sig Start Date End Date Taking? Authorizing Provider  albuterol (PROAIR HFA) 108 (90 BASE) MCG/ACT inhaler Inhale 2 puffs into the lungs every 6 (six) hours as needed for wheezing. 2 puffs every 4 hours as needed only  if your can't catch your breath 05/03/14   Belkys A Regalado, MD   albuterol (PROVENTIL) (2.5 MG/3ML) 0.083% nebulizer solution Take 3 mLs (2.5 mg total) by nebulization every 4 (four) hours as needed for wheezing or shortness of breath. 05/03/14   Belkys A Regalado, MD  azithromycin (ZITHROMAX) 500 MG tablet Take 1 tablet (500 mg total) by mouth daily. 05/03/14   Belkys A Regalado, MD  budesonide-formoterol (SYMBICORT) 160-4.5 MCG/ACT inhaler Inhale 2 puffs into the lungs 2 (two) times daily. 10/28/13   Nyoka Cowden, MD  Multiple Vitamin (MULTIVITAMIN WITH MINERALS) TABS tablet Take 1 tablet by mouth daily.    Historical Provider, MD  predniSONE (DELTASONE) 20 MG tablet Take 3 tablets for 3 days then 2 tablet for 3 days then 1 tablet for 2 days then stop. 05/03/14   Belkys A Regalado, MD   BP 193/101  Pulse 109  Resp 18  Ht 5\' 11"  (1.803 m)  Wt 210 lb (95.255 kg)  BMI 29.30 kg/m2  SpO2 98% Physical Exam  Constitutional: He is oriented to person, place, and time. He appears well-developed and well-nourished. He appears distressed.  HENT:  Head: Normocephalic and atraumatic.  Eyes: Conjunctivae are normal. Pupils are equal, round, and reactive to light.  Neck: Normal range of motion. Neck supple.  Cardiovascular: Normal rate, regular rhythm and intact distal pulses.   Pulmonary/Chest: No stridor. He is in respiratory distress. He has wheezes.  Abdominal: Soft. Bowel sounds are normal. There is no tenderness. There is no rebound and no guarding.  Musculoskeletal: Normal range of motion.  Neurological: He is  alert and oriented to person, place, and time.  Skin: Skin is warm and dry.  Psychiatric: He has a normal mood and affect.    ED Course  Procedures (including critical care time) Labs Review Labs Reviewed  CBC WITH DIFFERENTIAL  PRO B NATRIURETIC PEPTIDE  BLOOD GAS, VENOUS  I-STAT TROPOININ, ED  I-STAT CHEM 8, ED    Imaging Review Dg Chest Port 1 View  05/05/2014   CLINICAL DATA:  Shortness of breath  EXAM: PORTABLE CHEST - 1 VIEW   COMPARISON:  05/02/2014  FINDINGS: Emphysematous changes in the lungs. The heart size and mediastinal contours are within normal limits. Both lungs are clear. The visualized skeletal structures are unremarkable. No change since prior study.  IMPRESSION: No active disease.   Electronically Signed   By: Burman NievesWilliam  Stevens M.D.   On: 05/05/2014 04:13     EKG Interpretation   Date/Time:  Monday May 05 2014 04:00:13 EDT Ventricular Rate:  106 PR Interval:    QRS Duration: 92 QT Interval:  330 QTC Calculation: 438 R Axis:   77 Text Interpretation:  Sinus tachycardia Confirmed by [facility]  MD,  Analiah Drum ([id number]) on 05/05/2014 4:02:20 AM      MDM   Final diagnoses:  None    MDM Reviewed: previous chart, nursing note and vitals Reviewed previous: labs, ECG and x-ray Interpretation: labs, ECG and x-ray (NO chf on cxr,) Total time providing critical care: 30-74 minutes. This excludes time spent performing separately reportable procedures and services. Consults: admitting MD   Medications  azithromycin (ZITHROMAX) 500 mg in dextrose 5 % 250 mL IVPB (not administered)  albuterol (PROVENTIL,VENTOLIN) solution continuous neb (not administered)  cefTRIAXone (ROCEPHIN) 1 g in dextrose 5 % 50 mL IVPB (1 g Intravenous New Bag/Given 05/05/14 0445)  cefTRIAXone (ROCEPHIN) 1 g in dextrose 5 % 50 mL IVPB (not administered)  albuterol (PROVENTIL) (2.5 MG/3ML) 0.083% nebulizer solution 5 mg (5 mg Nebulization Given 05/05/14 0425)  ipratropium (ATROVENT) nebulizer solution 0.5 mg (0.5 mg Nebulization Given 05/05/14 0425)  albuterol (PROVENTIL) (2.5 MG/3ML) 0.083% nebulizer solution (  Duplicate 05/05/14 0426)  ipratropium (ATROVENT) 0.02 % nebulizer solution (  Duplicate 05/05/14 0426)  nitroGLYCERIN (NITROGLYN) 2 % ointment 1 inch (1 inch Topical Given 05/05/14 0448)  methylPREDNISolone sodium succinate (SOLU-MEDROL) 125 mg/2 mL injection 125 mg (125 mg Intravenous Given 05/05/14 0449)   Results for  orders placed during the hospital encounter of 05/05/14  CBC WITH DIFFERENTIAL      Result Value Ref Range   WBC 8.8  4.0 - 10.5 K/uL   RBC 5.00  4.22 - 5.81 MIL/uL   Hemoglobin 14.9  13.0 - 17.0 g/dL   HCT 60.445.3  54.039.0 - 98.152.0 %   MCV 90.6  78.0 - 100.0 fL   MCH 29.8  26.0 - 34.0 pg   MCHC 32.9  30.0 - 36.0 g/dL   RDW 19.113.7  47.811.5 - 29.515.5 %   Platelets 312  150 - 400 K/uL   Neutrophils Relative % 70  43 - 77 %   Neutro Abs 6.2  1.7 - 7.7 K/uL   Lymphocytes Relative 25  12 - 46 %   Lymphs Abs 2.2  0.7 - 4.0 K/uL   Monocytes Relative 4  3 - 12 %   Monocytes Absolute 0.3  0.1 - 1.0 K/uL   Eosinophils Relative 1  0 - 5 %   Eosinophils Absolute 0.0  0.0 - 0.7 K/uL   Basophils Relative 0  0 -  1 %   Basophils Absolute 0.0  0.0 - 0.1 K/uL  I-STAT TROPOININ, ED      Result Value Ref Range   Troponin i, poc 0.00  0.00 - 0.08 ng/mL   Comment 3           I-STAT CHEM 8, ED      Result Value Ref Range   Sodium 141  137 - 147 mEq/L   Potassium 3.8  3.7 - 5.3 mEq/L   Chloride 105  96 - 112 mEq/L   BUN 26 (*) 6 - 23 mg/dL   Creatinine, Ser 4.09  0.50 - 1.35 mg/dL   Glucose, Bld 811 (*) 70 - 99 mg/dL   Calcium, Ion 9.14  7.82 - 1.30 mmol/L   TCO2 26  0 - 100 mmol/L   Hemoglobin 16.0  13.0 - 17.0 g/dL   HCT 95.6  21.3 - 08.6 %   Dg Chest 2 View  05/02/2014   CLINICAL DATA:  Shortness of breath.  EXAM: CHEST  2 VIEW  COMPARISON:  Chest radiograph November 24, 2013  FINDINGS: Cardiomediastinal silhouette is unremarkable. The lungs are clear without pleural effusions or focal consolidations. Similarly increased lung volumes with flattened hemidiaphragms. Trachea projects midline and there is no pneumothorax. Soft tissue planes and included osseous structures are non-suspicious. Mild degenerative change of the thoracic spine. Multiple EKG lines overlie the patient and may obscure subtle underlying pathology.  IMPRESSION: COPD without superimposed acute cardiopulmonary process.   Electronically Signed    By: Awilda Metro   On: 05/02/2014 06:09   Dg Chest Port 1 View  05/05/2014   CLINICAL DATA:  Shortness of breath  EXAM: PORTABLE CHEST - 1 VIEW  COMPARISON:  05/02/2014  FINDINGS: Emphysematous changes in the lungs. The heart size and mediastinal contours are within normal limits. Both lungs are clear. The visualized skeletal structures are unremarkable. No change since prior study.  IMPRESSION: No active disease.   Electronically Signed   By: Burman Nieves M.D.   On: 05/05/2014 04:13    CRITICAL CARE Performed by: Jasmine Awe Total critical care time: 61 minutes Critical care time was exclusive of separately billable procedures and treating other patients. Critical care was necessary to treat or prevent imminent or life-threatening deterioration. Critical care was time spent personally by me on the following activities: development of treatment plan with patient and/or surrogate as well as nursing, discussions with consultants, evaluation of patient's response to treatment, examination of patient, obtaining history from patient or surrogate, ordering and performing treatments and interventions, ordering and review of laboratory studies, ordering and review of radiographic studies, pulse oximetry and re-evaluation of patient's condition.    Jasmine Awe, MD 05/05/14 [phone]

## 2014-05-05 NOTE — ED Notes (Signed)
Need to wait until the room and bed are cleaned.

## 2014-05-05 NOTE — H&P (Signed)
[facility] History and Physical  Billy [name] NWG:[id number] DOB: 1946-04-12 DOA: 05/05/2014  Referring physician: EDP PCP: Georgann Housekeeper, MD   Chief Complaint: SOB   HPI: Billy [name] is a 68 y.o. male who was just discharged from our service on Sat after a hospital stay and treatment for COPD exacerbation.  On Sunday he didn't get his prednisone filled (had planned on doing it today Monday he states).  Unfortunately by the evening on Sunday he had acute relapse of his COPD symptoms, severe SOB, wheezing, some cough.  He returns to the ED in frank respiratory distress.  Work up in the ED is again c/w worsening of his COPD exacerbation with initial O2 sat in the upper 80s.  Review of Systems: Systems reviewed.  As above, otherwise negative  Past Medical History  Diagnosis Date  . Intermittent asthma   . Rhinitis   . COPD (chronic obstructive pulmonary disease)    Past Surgical History  Procedure Laterality Date  . Cholecystectomy  2009   Social History:  reports that he quit smoking about 26 years ago. His smoking use included Cigarettes. He has a 30 pack-year smoking history. He has never used smokeless tobacco. He reports that he does not drink alcohol or use illicit drugs.  No Known Allergies  History reviewed. No pertinent family history.   Prior to Admission medications   Medication Sig Start Date End Date Taking? Authorizing Provider  albuterol (PROAIR HFA) 108 (90 BASE) MCG/ACT inhaler Inhale 2 puffs into the lungs every 6 (six) hours as needed for wheezing. 2 puffs every 4 hours as needed only  if your can't catch your breath 05/03/14  Yes Belkys A Regalado, MD  albuterol (PROVENTIL) (2.5 MG/3ML) 0.083% nebulizer solution Take 3 mLs (2.5 mg total) by nebulization every 4 (four) hours as needed for wheezing or shortness of breath. 05/03/14  Yes Belkys A Regalado, MD  azithromycin (ZITHROMAX) 500 MG tablet Take 1 tablet (500 mg total) by mouth daily. 05/03/14   Yes Belkys A Regalado, MD  budesonide-formoterol (SYMBICORT) 160-4.5 MCG/ACT inhaler Inhale 2 puffs into the lungs 2 (two) times daily as needed (shortness of breath).   Yes Historical Provider, MD  Multiple Vitamin (MULTIVITAMIN WITH MINERALS) TABS tablet Take 1 tablet by mouth daily.   Yes Historical Provider, MD  predniSONE (DELTASONE) 20 MG tablet Take 3 tablets for 3 days then 2 tablet for 3 days then 1 tablet for 2 days then stop. 05/03/14  Yes Alba Cory, MD   Physical Exam: Filed Vitals:   05/05/14 0510  BP:   Pulse:   Temp: 97.9 F (36.6 C)  Resp:     BP 118/64  Pulse 105  Temp(Src) 97.9 F (36.6 C) (Oral)  Resp 13  Ht 5\' 11"  (1.803 m)  Wt 95.255 kg (210 lb)  BMI 29.30 kg/m2  SpO2 97%  General Appearance:    Alert, oriented, no distress, appears stated age  Head:    Normocephalic, atraumatic  Eyes:    PERRL, EOMI, sclera non-icteric        Nose:   Nares patent, some mucous in nose.  Throat:   Moist mucous membranes. Oropharynx without erythema or exudate.  Neck:   Supple. No carotid bruits.  No thyromegaly.  No lymphadenopathy.   Back:     No CVA tenderness, no spinal tenderness  Lungs:     Poor air movement bilaterally with diffuse wheezing, patient is splinting in room and has obvious increased work of breathing.  Chest wall:    No tenderness to palpitation  Heart:    Regular rate and rhythm without murmurs, gallops, rubs  Abdomen:     Soft, non-tender, nondistended, normal bowel sounds, no organomegaly  Genitalia:    deferred  Rectal:    deferred  Extremities:   No clubbing, cyanosis or edema.  Pulses:   2+ and symmetric all extremities  Skin:   Skin color, texture, turgor normal, no rashes or lesions  Lymph nodes:   Cervical, supraclavicular, and axillary nodes normal  Neurologic:   CNII-XII intact. Normal strength, sensation and reflexes      throughout    Labs on Admission:  Basic Metabolic Panel:  Recent Labs Lab 05/02/14 0540 05/02/14 1239  05/03/14 0435 05/05/14 0448  NA 141  --  140 141  K 3.8  --  4.1 3.8  CL 101  --  103 105  CO2 25  --  26  --   GLUCOSE 119*  --  151* 101*  BUN 19  --  18 26*  CREATININE 1.28 1.16 1.10 1.30  CALCIUM 9.5  --  9.1  --    Liver Function Tests: No results found for this basename: AST, ALT, ALKPHOS, BILITOT, PROT, ALBUMIN,  in the last 168 hours No results found for this basename: LIPASE, AMYLASE,  in the last 168 hours No results found for this basename: AMMONIA,  in the last 168 hours CBC:  Recent Labs Lab 05/02/14 0540 05/03/14 0435 05/05/14 0436 05/05/14 0448  WBC 8.6 15.5* 8.8  --   NEUTROABS 5.5  --  6.2  --   HGB 14.6 12.7* 14.9 16.0  HCT 44.6 38.8* 45.3 47.0  MCV 89.7 88.4 90.6  --   PLT 303 311 312  --    Cardiac Enzymes:  Recent Labs Lab 05/02/14 1239 05/02/14 1842  TROPONINI <0.30 <0.30    BNP (last 3 results)  Recent Labs  05/02/14 0537 05/05/14 0437  PROBNP 135.4* 397.8*   CBG: No results found for this basename: GLUCAP,  in the last 168 hours  Radiological Exams on Admission: Dg Chest Port 1 View  05/05/2014   CLINICAL DATA:  Shortness of breath  EXAM: PORTABLE CHEST - 1 VIEW  COMPARISON:  05/02/2014  FINDINGS: Emphysematous changes in the lungs. The heart size and mediastinal contours are within normal limits. Both lungs are clear. The visualized skeletal structures are unremarkable. No change since prior study.  IMPRESSION: No active disease.   Electronically Signed   By: Burman Nieves M.D.   On: 05/05/2014 04:13    EKG: Independently reviewed.  Assessment/Plan Principal Problem:   COPD exacerbation Active Problems:   Asthma, severe persistent, poorly-controlled   COPD mixed type   Acute respiratory failure   1. COPD exacerbation and acute respiratory failure primarily hypoxic but with some hypercapnia as well 1. Adult wheeze protocol, currently undergoing hour long neb treatment in ED 2. Solumedrol 60mg  IV q12h, got 125mg  initially  in ED. 3. Azithromycin, also checking legionella antigen in urine since he just installed an old window [facility] in his house yesterday, seems unlikely though as he has no CXR findings of PNA. 4. Hypercapnia - due to poor air movement    Code Status: Full Code  Family Communication: No family in room Disposition Plan:  Admit to inpatient  Time spent: 70 min  GARDNER, JARED M. [facility] Pager [phone]  If 7AM-7PM, please contact the day team taking care of the patient [email or web address] Password [email or web address]  05/05/2014, 5:17 AM

## 2014-05-05 NOTE — Progress Notes (Signed)
RN called to tell me patient was done with CAT. CAT stopped at this time by RN due to RT being with an intubated patient. I stopped by to check on the patient later and patient states he is feeling much better with only a little tightness in his chest at this time. MD to reassess patient and go from there. RT will continue to assist as needed.

## 2014-05-05 NOTE — ED Notes (Signed)
Patient very anxious due to the fact that he is having trouble breathing.  [facility].  Stated this morning he woke up and was having trouble breathing.

## 2014-05-06 DIAGNOSIS — J449 Chronic obstructive pulmonary disease, unspecified: Secondary | ICD-10-CM

## 2014-05-06 DIAGNOSIS — J441 Chronic obstructive pulmonary disease with (acute) exacerbation: Secondary | ICD-10-CM

## 2014-05-06 DIAGNOSIS — J45902 Unspecified asthma with status asthmaticus: Secondary | ICD-10-CM

## 2014-05-06 DIAGNOSIS — R0609 Other forms of dyspnea: Secondary | ICD-10-CM

## 2014-05-06 DIAGNOSIS — R0989 Other specified symptoms and signs involving the circulatory and respiratory systems: Secondary | ICD-10-CM

## 2014-05-06 MED ORDER — BUSPIRONE HCL 5 MG PO TABS
5.0000 mg | ORAL_TABLET | Freq: Two times a day (BID) | ORAL | Status: DC
  Administered 2014-05-06: 5 mg via ORAL
  Filled 2014-05-06 (×2): qty 1

## 2014-05-06 MED ORDER — FUROSEMIDE 10 MG/ML IJ SOLN
20.0000 mg | Freq: Once | INTRAMUSCULAR | Status: AC
  Administered 2014-05-06: 20 mg via INTRAVENOUS
  Filled 2014-05-06: qty 2

## 2014-05-06 MED ORDER — BUDESONIDE-FORMOTEROL FUMARATE 160-4.5 MCG/ACT IN AERO: 2.0000 | INHALATION_SPRAY | Freq: Two times a day (BID) | RESPIRATORY_TRACT | Status: DC | PRN

## 2014-05-06 MED ORDER — PREDNISONE 10 MG PO TABS: ORAL_TABLET | ORAL | Status: DC

## 2014-05-06 MED ORDER — AZITHROMYCIN 500 MG PO TABS: 500.0000 mg | ORAL_TABLET | Freq: Every day | ORAL | Status: DC

## 2014-05-06 MED ORDER — PREDNISONE 20 MG PO TABS: ORAL_TABLET | ORAL | Status: DC

## 2014-05-06 MED ORDER — TIOTROPIUM BROMIDE MONOHYDRATE 18 MCG IN CAPS
18.0000 ug | ORAL_CAPSULE | Freq: Every day | RESPIRATORY_TRACT | Status: DC
  Administered 2014-05-06: 18 ug via RESPIRATORY_TRACT
  Filled 2014-05-06: qty 5

## 2014-05-06 MED ORDER — TIOTROPIUM BROMIDE MONOHYDRATE 18 MCG IN CAPS: 18.0000 ug | ORAL_CAPSULE | Freq: Every day | RESPIRATORY_TRACT | Status: DC

## 2014-05-06 MED ORDER — BUSPIRONE HCL 5 MG PO TABS: 5.0000 mg | ORAL_TABLET | Freq: Two times a day (BID) | ORAL | Status: DC

## 2014-05-06 MED ORDER — LORAZEPAM 0.5 MG PO TABS
0.5000 mg | ORAL_TABLET | Freq: Once | ORAL | Status: AC
  Administered 2014-05-06: 0.5 mg via ORAL
  Filled 2014-05-06: qty 1

## 2014-05-06 NOTE — Progress Notes (Signed)
Echo Lab  2D Echocardiogram completed.  Billy [name] L Cinch Ormond, RDCS 05/06/2014 12:46 PM

## 2014-05-06 NOTE — Discharge Summary (Signed)
Pt seen and examined. Agree with discharge summary per above. Pt presents with COPD exacerbation, improved with steroids and neb tx. 2d echo unremarkable. Pt to follow up closely with Pulm as outpatient.

## 2014-05-06 NOTE — Progress Notes (Signed)
[facility] notified that pt is c/o pain when he lays on his left side and that he is requesting medication for sleep Orders given and K-pad was placed on pt as ordered. Pt c/o that the K-pad  Increased his shortness of breath respiratory was call and administered a breathing treatment O2 stats 92% on room air. Will continue to monitor. Ilean SkillVeronica Carisa Backhaus LPN

## 2014-05-06 NOTE — Progress Notes (Signed)
[facility] paged to inform that Toradol can cause bronchospasm in Asthma pt and that the pharmacy has been notified and medication add to pt list of Allergies. Pt was also educated to include medication as a allergy when listing. Ilean SkillVeronica Shameka Aggarwal LPN

## 2014-05-06 NOTE — Progress Notes (Signed)
Re: Toradol  Earlier in shift, nurse Suzette BattiestVeronica paged this NP secondary to pt c/o rib cage/chest pain which was triggered by lying on his side or general movements. Given this sounded MSK in nature, this NP ordered one dose of Toradol and a Kpad to rib cage.  Later, nurse paged again stating pt was sitting up in bed c/o SOB. At that time, he claimed we had caused a "COPD exacerbation" because we used a KPAD for his pain. At no time after the dosing of Toradol did the pt c/o SOB or other sx of bronchospasm. When this NP talked with nurse, pt had no wheezing, increased RR, labored breathing, stridor or desaturation. This NP highly doubts Toradol caused any adverse reaction.  Craige CottaKirby, NP

## 2014-05-06 NOTE — Progress Notes (Signed)
CARE MANAGEMENT NOTE 05/06/2014  Patient:  Billy [name],Billy [name]   Account Number:  [id number]  Date Initiated:  05/05/2014  Documentation initiated by:  Donn PieriniWEBSTER,KRISTI  Subjective/Objective Assessment:   Pt admitted with COPD- acute resp. failure     Action/Plan:   PTA pt lived at home alone-   Anticipated DC Date:  05/06/2014   Anticipated DC Plan:  HOME/SELF CARE      DC Planning Services  CM consult      Choice offered to / List presented to:             Status of service:  Completed, signed off Medicare Important Message given?  NO (If response is "NO", the following Medicare IM given date fields will be blank) Date Medicare IM given:   Medicare IM given by:   Date Additional Medicare IM given:   Additional Medicare IM given by:    Discharge Disposition:  HOME/SELF CARE  Per UR Regulation:  Reviewed for med. necessity/level of care/duration of stay  If discussed at Long Length of Stay Meetings, dates discussed:    Comments:

## 2014-05-06 NOTE — Progress Notes (Signed)
[facility] Notified that is is c/o of shortness of breath with the Venti mask at 4L O2 stats 94% and that he is having pain and that he feels he can't get enough air and want oxygen increased. Lung sounds are clear new orders received will continue to monitor. Ilean SkillVeronica Kiyan Burmester LPN

## 2014-05-06 NOTE — Progress Notes (Signed)
Billy NeriJames R [name] to be D/C'd Home per MD order.  Discussed with the patient and all questions fully answered.    Medication List         albuterol 108 (90 BASE) MCG/ACT inhaler  Commonly known as:  PROAIR HFA  Inhale 2 puffs into the lungs every 6 (six) hours as needed for wheezing. 2 puffs every 4 hours as needed only  if your can't catch your breath     albuterol (2.5 MG/3ML) 0.083% nebulizer solution  Commonly known as:  PROVENTIL  Take 3 mLs (2.5 mg total) by nebulization every 4 (four) hours as needed for wheezing or shortness of breath.     azithromycin 500 MG tablet  Commonly known as:  ZITHROMAX  Take 1 tablet (500 mg total) by mouth daily.     budesonide-formoterol 160-4.5 MCG/ACT inhaler  Commonly known as:  SYMBICORT  Inhale 2 puffs into the lungs 2 (two) times daily as needed (shortness of breath).     busPIRone 5 MG tablet  Commonly known as:  BUSPAR  Take 1 tablet (5 mg total) by mouth 2 (two) times daily.     multivitamin with minerals Tabs tablet  Take 1 tablet by mouth daily.     predniSONE 10 MG tablet  Commonly known as:  DELTASONE  Take 20 mg daily for 3 days then take 10 mg daily until you see Dr. Sherene SiresWert.  In 5-7 days.     tiotropium 18 MCG inhalation capsule  Commonly known as:  SPIRIVA HANDIHALER  Place 1 capsule (18 mcg total) into inhaler and inhale daily.        VVS, Skin clean, dry and intact without evidence of skin break down, no evidence of skin tears noted. IV catheter discontinued intact. Site without signs and symptoms of complications. Dressing and pressure applied.  An After Visit Summary was printed and given to the patient.  Respiratory Therapy provided education to patient about Spiriva.   D/c education completed with patient/family including follow up instructions, medication list, d/c activities limitations if indicated, with other d/c instructions as indicated by MD - patient able to verbalize understanding, all questions fully  answered.   Patient instructed to return to ED, call 911, or call MD for any changes in condition.   Patient escorted via WC, and D/C home via private auto.  [name], [name] 05/06/2014 4:20 PM

## 2014-05-06 NOTE — Discharge Summary (Signed)
Physician Discharge Summary  OLEN EAVES QMV:[id number] DOB: 10-Dec-1945 DOA: 05/05/2014  PCP: Georgann Housekeeper, MD  Admit date: 05/05/2014 Discharge date: 05/06/2014  Time spent: 60 minutes  Recommendations for Outpatient Follow-up:  1. Follow up with pulmonology  2. Consider low dose buspar - anti anxiety 3. Establish with PCP.  Discharge Diagnoses:  Principal Problem:   COPD exacerbation Active Problems:   Asthma, severe persistent, poorly-controlled   COPD mixed type   Acute respiratory failure   Discharge Condition: stable.  Diet recommendation: heart healthy  Filed Weights   05/05/14 0404 05/05/14 1843  Weight: 95.255 kg (210 lb) 92.987 kg (205 lb)    History of present illness:  68 y.o. Billy [name] who was [facility] after a hospital stay and treatment for a COPD exacerbation. He did not take his prednisone after discharge. Unfortunately he suffered an acute relapse of his COPD symptoms due to noncompliance with his prescribed medication regimen, with severe SOB, wheezing, some cough. He returned to the ED in frank respiratory distress and was admitted to the step down unit.  Hospital Course:   COPD with hypercapnia and bronchospasm  Patient's symptoms may be anxiety and non compliance based.    After a long conversation with the patient I have learned that he questions each medication given to him and does not believe he should be on medications long term.  He has absolutely no wheezing, but describes sudden SOB that comes on when lying on his left side at night.  2d Echo was unrevealing.  Gave patient low dose IV lasix as he had edema in his legs.  Recommended adding Spiriva (COPD) and Buspar (anxiety / to prevent bronchospasm).  I don't know if the patient will actually try these.  I believe he will want to discuss them with [name] prior to purchasing them.  He will be discharged home with Azithromycin / prednisone / Symbicort / Spiriva / Buspar / Abuterol  and instructions to see [name] within the week.   Further the patient does not actually have a PCP.  We encouraged him to formally establish with Dr. Donette Larry.  Procedures:  2D Echo Study Conclusions  - Left ventricle: The cavity size was normal. Wall thickness was normal. The estimated ejection fraction was 60%. Wall motion was normal; there were no regional wall motion abnormalities. - Right ventricle: The cavity size was normal. Systolic function was normal.   Consultations:  none  Discharge Exam: Filed Vitals:   05/06/14 1351  BP: 119/71  Pulse: 85  Temp: 98.1 F (36.7 C)  Resp: 20    General: Awake and alert, seems slightly anxious.  High flow oxygen in hand.   Cardiovascular: rrr no m/r/g, 1+ lower extremity edema Respiratory: clear to auscultation, no accessory muscle use.  No wheeze.  Patient stops using oxygen.  Speaks at length with no dyspnea. Abdomen:  Nt, distended but not firm, + bowel sounds, no organomegaly. M/S:  5/5 strength in each.  Able to ambulate about the room with out difficulty.  Discharge Instructions    Medication List         albuterol 108 (90 BASE) MCG/ACT inhaler  Commonly known as:  PROAIR HFA  Inhale 2 puffs into the lungs every 6 (six) hours as needed for wheezing. 2 puffs every 4 hours as needed only  if your can't catch your breath     albuterol (2.5 MG/3ML) 0.083% nebulizer solution  Commonly known as:  PROVENTIL  Take 3 mLs (2.5 mg  total) by nebulization every 4 (four) hours as needed for wheezing or shortness of breath.     azithromycin 500 MG tablet  Commonly known as:  ZITHROMAX  Take 1 tablet (500 mg total) by mouth daily.     budesonide-formoterol 160-4.5 MCG/ACT inhaler  Commonly known as:  SYMBICORT  Inhale 2 puffs into the lungs 2 (two) times daily as needed (shortness of breath).     busPIRone 5 MG tablet  Commonly known as:  BUSPAR  Take 1 tablet (5 mg total) by mouth 2 (two) times daily.     multivitamin  with minerals Tabs tablet  Take 1 tablet by mouth daily.     predniSONE 10 MG tablet  Commonly known as:  DELTASONE  Take 20 mg daily for 3 days then take 10 mg daily until you see [name].  In 5-7 days.     tiotropium 18 MCG inhalation capsule  Commonly known as:  SPIRIVA HANDIHALER  Place 1 capsule (18 mcg total) into inhaler and inhale daily.       Allergies  Allergen Reactions  . Toradol [Ketorolac Tromethamine]     Short of breath ? As to whether this was related.   Follow-up Information   Follow up with Georgann Housekeeper, MD In 1 week.   Specialty:  Internal Medicine   Contact information:   [address], [address] [place] [address] [phone]       Follow up with Sandrea Hughs, MD. (as previously scheduled.)    Specialty:  Pulmonary Disease   Contact information:   [address] [place] [address] [phone]        The results of significant diagnostics from this hospitalization (including imaging, microbiology, ancillary and laboratory) are listed below for reference.    Significant Diagnostic Studies: Dg Chest 2 View  05/02/2014   CLINICAL DATA:  Shortness of breath.  EXAM: CHEST  2 VIEW  COMPARISON:  Chest radiograph November 24, 2013  FINDINGS: Cardiomediastinal silhouette is unremarkable. The lungs are clear without pleural effusions or focal consolidations. Similarly increased lung volumes with flattened hemidiaphragms. Trachea projects midline and there is no pneumothorax. Soft tissue planes and included osseous structures are non-suspicious. Mild degenerative change of the thoracic spine. Multiple EKG lines overlie the patient and may obscure subtle underlying pathology.  IMPRESSION: COPD without superimposed acute cardiopulmonary process.   Electronically Signed   By: Awilda Metro   On: 05/02/2014 06:09   Dg Chest Port 1 View  05/05/2014   CLINICAL DATA:  Shortness of breath  EXAM: PORTABLE CHEST - 1 VIEW  COMPARISON:  05/02/2014   FINDINGS: Emphysematous changes in the lungs. The heart size and mediastinal contours are within normal limits. Both lungs are clear. The visualized skeletal structures are unremarkable. No change since prior study.  IMPRESSION: No active disease.   Electronically Signed   By: Burman Nieves M.D.   On: 05/05/2014 04:13    Labs: Basic Metabolic Panel:  Recent Labs Lab 05/02/14 0540 05/02/14 1239 05/03/14 0435 05/05/14 0448  NA 141  --  140 141  K 3.8  --  4.1 3.8  CL 101  --  103 105  CO2 25  --  26  --   GLUCOSE 119*  --  151* 101*  BUN 19  --  18 26*  CREATININE 1.28 1.16 1.10 1.30  CALCIUM 9.5  --  9.1  --    CBC:  Recent Labs Lab 05/02/14 0540 05/03/14 0435 05/05/14 0436  05/05/14 0448  WBC 8.6 15.5* 8.8  --   NEUTROABS 5.5  --  6.2  --   HGB 14.6 12.7* 14.9 16.0  HCT 44.6 Billy.8* 45.3 47.0  MCV 89.7 88.4 90.6  --   PLT 303 311 312  --    Cardiac Enzymes:  Recent Labs Lab 05/02/14 1239 05/02/14 1842  TROPONINI <0.30 <0.30   BNP: BNP (last 3 results)  Recent Labs  05/02/14 0537 05/05/14 0437  PROBNP 135.4* 397.8*     Signed:  Conley CanalYork, Jenefer Woerner L, PA-C [phone]  [facility] 05/06/2014, 4:28 PM

## 2014-05-08 ENCOUNTER — Encounter: Payer: Self-pay | Admitting: Internal Medicine

## 2014-05-08 ENCOUNTER — Ambulatory Visit (INDEPENDENT_AMBULATORY_CARE_PROVIDER_SITE_OTHER): Payer: Medicare Other | Admitting: Internal Medicine

## 2014-05-08 VITALS — BP 140/86 | HR 81 | Temp 98.1°F | Ht 69.0 in | Wt 210.4 lb

## 2014-05-08 DIAGNOSIS — J4489 Other specified chronic obstructive pulmonary disease: Secondary | ICD-10-CM

## 2014-05-08 DIAGNOSIS — J449 Chronic obstructive pulmonary disease, unspecified: Secondary | ICD-10-CM

## 2014-05-08 MED ORDER — FAMOTIDINE 20 MG PO TABS: ORAL_TABLET | ORAL | Status: AC

## 2014-05-08 NOTE — Progress Notes (Signed)
Subjective:     Patient ID: Billy [name], male   DOB: Mar 09, 1946 .   MRN: [id number]   Brief patient profile:  68 yowm quit smoking 1990 with cr/ asthma and recurrent flares off meds due to nonadherence attributed to  insurance/cost issues and being "thick headed" (pt's description)   History of Present Illness  04/07/2011 Initial pulmonary office eval in EMR era p trip to ER 6/26 and placed on prednisone taper plus nebulizer and albuterol but not any maint rx,  Nasal obst symptoms predominate but has yet to see his ENT doc [name]) no purulent sputum.   >>symbicort rx   02/01/2012 Acute OV  Complains of increased SOB, tightness/pressure in chest for 2 weeks. Went to ED 4.16.13 and was given pred 60mg  and neb tx.  CXR showed NAD , was recommended to repeat for nipple markers re -left density ? Nipple shadow.  He does not have insurance or rx coverage.  Was seen ~1 year ago, dx with possible asthma , rx Symbicort.  He is self employed . No insurance will be on medicare in June this year.  Has a lot of nasal congestion , stuffiness and congestion . After took prednisone he felt much better , nasal stuffiness was much better. Feels great now . Breathing is much better. Nasal stuffiness is better.  FEV1 1.43 L , 41%, 45 ratio  rec Begin Symbicort 160/4.59mcg 2 puffs Twice daily  -brush/rinse and gargle  Nasonex 2 puffs Twice daily  - until sample is gone.  Saline nasal rinses As needed   Amoxicillin 500mg  Three times a day  For 10 days  Prednisone taper as directed.  I will call with xray results.  follow up [name]  In 2 months with PFT and As needed   Date of Admission: 11/24/2012   Date of Discharge: 11/26/2012  Attending Physician: [name], MD  Discharge Diagnosis:  Principal Problem:  COPD with acute exacerbation  Active Problems:  Influenza       10/28/2013 f/u ov/Wert re: chronic asthma/ non-adherent with symbicort / requesting saba samples Chief Complaint   Patient presents with  . Follow-up    Pt states his breathing is doing well. Has not had to use rescue inhaler since his last visit. No new co's today.   Says Only used the saba once 10/25/13 but "could I have another sample"  - stopped symbicort months prior to OV  And did ok until the last week or so prior to OV  With increasing nasal congestion and still has not scheduled surgery per Dr [name]. No purulent secrtions. Not limited from desired activities by sob rec See Dr [name] asap to get your nasal obstruction addressed and let him address what medications you need for it  Plan A = automatic =  Symbicort 160 Take 2 puffs first thing in am and then another 2 puffs about 12 hours later.  Plan B = Backup Only use your albuterol inhaler (Proair)as a rescue medication to be used if you can't catch your breath by resting or doing a relaxed purse lip breathing pattern.  - The less you use it, the better it will work when you need it. - Ok to use up to 2 puffs  every 4 hours if you must but call for immediate appointment if use goes up over your usual need - Don't leave home without it !!  (think of it like the spare tire for your car)  Plan C =  only use neb if you try the proair first and it doesn't work, but call me right away for appointment asap in event you start needed the neb    S/p sinus surgery March 2015 by Dr Ezzard StandingNewman> maintain on flonase  Stopped symbicort then sev days later woke up 4 am > to ER    05/08/2014 f/u ov/Wert re: poorly controlled asthma/ documented non-adherence Chief Complaint  Patient presents with  . HFU    Pt states breathing is back to normal baseline. He c/o chest discomfort for approx 1 wk- alkaseltzer helps.  He states that he was advised by the hospitalist to replace symbicort with albuterol daily.    could not articulate action plan which we have reviewed directly with him and given copies multiple times and focused instead on how badly the er treats him  when he goes   Nasal symptoms much better on just the flonase and saline rinses    No obvious daytime variabilty or assoc chronic cough or cp or chest tightness, subjective wheeze overt  hb symptoms. No unusual exp hx or h/o childhood pna/ asthma or knowledge of premature birth.  Sleeping ok without nocturnal  or early am exacerbation  of respiratory  c/o's or need for noct saba. Also denies any obvious fluctuation of symptoms with weather or environmental changes or other aggravating or alleviating factors except as outlined above   Current Medications, Allergies, Past Medical History, Past Surgical History, Family History, and Social History were reviewed in [facility] electronic medical record.  ROS  The following are not active complaints unless bolded sore throat, dysphagia, dental problems, itching, sneezing,  nasal congestion or excess/ purulent secretions, ear ache,   fever, chills, sweats, unintended wt loss, pleuritic or exertional cp, hemoptysis,  orthopnea pnd or leg swelling, presyncope, palpitations, heartburn, abdominal pain, anorexia, nausea, vomiting, diarrhea  or change in bowel or urinary habits, change in stools or urine, dysuria,hematuria,  rash, arthralgias, visual complaints, headache, numbness weakness or ataxia or problems with walking or coordination,  change in mood/affect or memory.                 Objective:   Physical Exam   amb wm nad   Wt 187 04/07/2011>>02/01/2012 196 > 04/02/2012  202 > 07/04/2012  202>  199 11/29/2012 > 203 03/20/2013 > 07/11/2013 204 > 10/28/2013  > 05/08/2014 210    HEENT: nl dentition, turbinates, and orophanx. Nl external ear canals without cough reflex   NECK :  without JVD/Nodes/TM/ nl carotid upstrokes bilaterally   LUNGS: no acc muscle use,  Bilateral end exp wheeze    CV:  RRR  no s3 or murmur or increase in P2, no edema   ABD:  soft and nontender with nl excursion in the supine position. No bruits or organomegaly,  bowel sounds nl  MS:  warm without deformities, calf tenderness, cyanosis or clubbing  SKIN: warm and dry without lesions    NEURO:  alert, approp, no deficits    pCXR 05/05/14  No active disease.     Assessment:

## 2014-05-08 NOTE — Patient Instructions (Addendum)
Plan A = automatic =  Symbicort 160 Take 2 puffs first thing in am and then another 2 puffs about 12 hours later.  Plan B = Backup Only use your albuterol inhaler (Proair)as a rescue medication to be used if you can't catch your breath by resting or doing a relaxed purse lip breathing pattern.  - The less you use it, the better it will work when you need it. - Ok to use up to 2 puffs  every 4 hours if you must but call for immediate appointment if use goes up over your usual need - Don't leave home without it !!  (think of it like the spare tire for your car)  Plan C = only use neb up to every 4 hours  if you try the proair first and it doesn't work, but call me right away for appointment asap in event you start needed the neb Plan D = Doctor, call if need to use the neb Plan E = ER only if all else fails   Add   pepcid 20 mg automatically at bedtime  GERD (REFLUX)  is an extremely common cause of respiratory symptoms, many times with no significant heartburn at all.    It can be treated with medication, but also with lifestyle changes including avoidance of late meals, excessive alcohol, smoking cessation, and avoid fatty foods, chocolate, peppermint, colas, red wine, and acidic juices such as orange juice.  NO MINT OR MENTHOL PRODUCTS SO NO COUGH DROPS  USE SUGARLESS CANDY INSTEAD (jolley ranchers or Stover's)  NO OIL BASED VITAMINS - use powdered substitutes.    Please schedule a follow up office visit in 4 weeks, sooner if needed with all meds for PFT's on return

## 2014-05-08 NOTE — Assessment & Plan Note (Addendum)
-    PFT's 03/16/2007 FEV1  2.29 (70%) ratio 57 with 10% resp to B2 and DLCO 86    - PFT's 07/04/2012 FEV1 1.02 (33%) 1.61 p B2 (58%) and DLCO 76%   -  PFT's 03/20/2013  FEV 1.96 (58%) and no better p B2 and DLCO 81%   DDX of  difficult airways management all start with A and  include Adherence, Ace Inhibitors, Acid Reflux, Active Sinus Disease, Alpha 1 Antitripsin deficiency, Anxiety masquerading as Airways dz,  ABPA,  allergy(esp in young), Aspiration (esp in elderly), Adverse effects of DPI,  Active smokers, plus two Bs  = Bronchiectasis and Beta blocker use..and one C= CHF  Adherence is always the initial "prime suspect" and is a multilayered concern that requires a "trust but verify" approach in every patient - starting with knowing how to use medications, especially inhalers, correctly, keeping up with refills and understanding the fundamental difference between maintenance and prns vs those medications only taken for a very short course and then stopped and not refilled.  The proper method of use, as well as anticipated side effects, of a metered-dose inhaler are discussed and demonstrated to the patient. Improved effectiveness after extensive coaching during this visit to a level of approximately  90%   Active sinus dz > continue rx  ? Acid (or non-acid) GERD > always difficult to exclude as up to 75% of pts in some series report no assoc GI/ Heartburn symptoms> rec   noct  acid suppression and diet restrictions/ reviewed and instructions given in writing.    ? Anxiety/ depression > probably contributing to failure to follow instructions if not also adding to his symptoms    Each maintenance medication was reviewed in detail including most importantly the difference between maintenance and as needed and under what circumstances the prns are to be used.  Please see instructions for details which were reviewed in writing and the patient given a copy.

## 2014-05-20 ENCOUNTER — Other Ambulatory Visit: Payer: Self-pay | Admitting: Internal Medicine

## 2014-05-20 MED ORDER — BUDESONIDE-FORMOTEROL FUMARATE 160-4.5 MCG/ACT IN AERO: 2.0000 | INHALATION_SPRAY | Freq: Two times a day (BID) | RESPIRATORY_TRACT | Status: AC | PRN

## 2014-05-21 ENCOUNTER — Emergency Department (HOSPITAL_COMMUNITY): Payer: Medicare Other

## 2014-05-21 ENCOUNTER — Encounter (HOSPITAL_COMMUNITY): Payer: Self-pay | Admitting: Emergency Medicine

## 2014-05-21 ENCOUNTER — Encounter: Payer: Self-pay | Admitting: Internal Medicine

## 2014-05-21 ENCOUNTER — Emergency Department (HOSPITAL_COMMUNITY)
Admission: EM | Admit: 2014-05-21 | Discharge: 2014-05-21 | Disposition: A | Discharge status: Home / Self Care | Payer: Medicare Other | Attending: Emergency Medicine | Admitting: Emergency Medicine

## 2014-05-21 ENCOUNTER — Ambulatory Visit (INDEPENDENT_AMBULATORY_CARE_PROVIDER_SITE_OTHER): Payer: Medicare Other | Admitting: Internal Medicine

## 2014-05-21 VITALS — BP 134/80 | HR 107 | Temp 97.8°F | Ht 69.0 in | Wt 211.0 lb

## 2014-05-21 DIAGNOSIS — Z87891 Personal history of nicotine dependence: Secondary | ICD-10-CM | POA: Insufficient documentation

## 2014-05-21 DIAGNOSIS — J441 Chronic obstructive pulmonary disease with (acute) exacerbation: Secondary | ICD-10-CM | POA: Insufficient documentation

## 2014-05-21 DIAGNOSIS — J45901 Unspecified asthma with (acute) exacerbation: Secondary | ICD-10-CM | POA: Diagnosis not present

## 2014-05-21 DIAGNOSIS — R0609 Other forms of dyspnea: Secondary | ICD-10-CM

## 2014-05-21 DIAGNOSIS — R0600 Dyspnea, unspecified: Secondary | ICD-10-CM

## 2014-05-21 DIAGNOSIS — R0789 Other chest pain: Secondary | ICD-10-CM

## 2014-05-21 DIAGNOSIS — Z79899 Other long term (current) drug therapy: Secondary | ICD-10-CM | POA: Insufficient documentation

## 2014-05-21 DIAGNOSIS — J449 Chronic obstructive pulmonary disease, unspecified: Secondary | ICD-10-CM

## 2014-05-21 DIAGNOSIS — R079 Chest pain, unspecified: Secondary | ICD-10-CM | POA: Insufficient documentation

## 2014-05-21 DIAGNOSIS — R0989 Other specified symptoms and signs involving the circulatory and respiratory systems: Secondary | ICD-10-CM

## 2014-05-21 LAB — BASIC METABOLIC PANEL
ANION GAP: 15 (ref 5–15)
BUN: 28 mg/dL — ABNORMAL HIGH (ref 6–23)
CALCIUM: 10.3 mg/dL (ref 8.4–10.5)
CO2: 26 mEq/L (ref 19–32)
Chloride: 100 mEq/L (ref 96–112)
Creatinine, Ser: 1.26 mg/dL (ref 0.50–1.35)
GFR calc Af Amer: 66 mL/min — ABNORMAL LOW (ref >90)
GFR, EST NON AFRICAN AMERICAN: 57 mL/min — AB (ref >90)
Glucose, Bld: 110 mg/dL — ABNORMAL HIGH (ref 70–99)
Potassium: 4.4 mEq/L (ref 3.7–5.3)
SODIUM: 141 meq/L (ref 137–147)

## 2014-05-21 LAB — CBC
HCT: 46.1 % (ref 39.0–52.0)
Hemoglobin: 15.1 g/dL (ref 13.0–17.0)
MCH: 29.3 pg (ref 26.0–34.0)
MCHC: 32.8 g/dL (ref 30.0–36.0)
MCV: 89.5 fL (ref 78.0–100.0)
PLATELETS: 235 10*3/uL (ref 150–400)
RBC: 5.15 MIL/uL (ref 4.22–5.81)
RDW: 13.5 % (ref 11.5–15.5)
WBC: 12.5 10*3/uL — ABNORMAL HIGH (ref 4.0–10.5)

## 2014-05-21 LAB — PRO B NATRIURETIC PEPTIDE: PRO B NATRI PEPTIDE: 198.2 pg/mL — AB (ref 0–125)

## 2014-05-21 MED ORDER — LEVALBUTEROL HCL 0.63 MG/3ML IN NEBU
1.2600 mg | INHALATION_SOLUTION | Freq: Once | RESPIRATORY_TRACT | Status: AC
  Administered 2014-05-21: 1.26 mg via RESPIRATORY_TRACT

## 2014-05-21 MED ORDER — IPRATROPIUM-ALBUTEROL 0.5-2.5 (3) MG/3ML IN SOLN
3.0000 mL | Freq: Once | RESPIRATORY_TRACT | Status: AC
  Administered 2014-05-21: 3 mL via RESPIRATORY_TRACT
  Filled 2014-05-21: qty 3

## 2014-05-21 MED ORDER — MOXIFLOXACIN HCL 400 MG PO TABS: 400.0000 mg | ORAL_TABLET | Freq: Every day | ORAL | Status: DC

## 2014-05-21 MED ORDER — ALBUTEROL (5 MG/ML) CONTINUOUS INHALATION SOLN
10.0000 mg/h | INHALATION_SOLUTION | Freq: Once | RESPIRATORY_TRACT | Status: AC
  Administered 2014-05-21: 10 mg/h via RESPIRATORY_TRACT
  Filled 2014-05-21: qty 20

## 2014-05-21 MED ORDER — METHYLPREDNISOLONE SODIUM SUCC 125 MG IJ SOLR
125.0000 mg | Freq: Once | INTRAMUSCULAR | Status: AC
  Administered 2014-05-21: 125 mg via INTRAVENOUS
  Filled 2014-05-21: qty 2

## 2014-05-21 MED ORDER — ALBUTEROL SULFATE (2.5 MG/3ML) 0.083% IN NEBU
2.5000 mg | INHALATION_SOLUTION | Freq: Once | RESPIRATORY_TRACT | Status: AC
  Administered 2014-05-21: 2.5 mg via RESPIRATORY_TRACT
  Filled 2014-05-21: qty 3

## 2014-05-21 MED ORDER — PREDNISONE 50 MG PO TABS: 50.0000 mg | ORAL_TABLET | Freq: Every day | ORAL | Status: DC

## 2014-05-21 NOTE — ED Notes (Signed)
Patient is inquiring if we can use the EKG he had this afternoon in his PCP office which is accounting for the delay in completion of the EKG.

## 2014-05-21 NOTE — ED Notes (Signed)
Respiratory at bedside.

## 2014-05-21 NOTE — Assessment & Plan Note (Signed)
-    PFT's 03/16/2007 FEV1  2.29 (70%) ratio 57 with 10% resp to B2 and DLCO 86    - PFT's 07/04/2012 FEV1 1.02 (33%) 1.61 p B2 (58%) and DLCO 76%   -  PFT's 03/20/2013  FEV 1.96 (58%) and no better p B2 and DLCO 81%      - 05/08/2014 p extensive coaching HFA effectiveness =    90%   Not clear this is a typical asthma attack based on how suddenly he decompensated ? After cleaning toilet bowl but can't break him in office > discussed wit ER triage nurse at [facility] > ok to personal transport by his daughter a nurse

## 2014-05-21 NOTE — Patient Instructions (Addendum)
Pt instructed to go to ER.

## 2014-05-21 NOTE — ED Provider Notes (Signed)
CSN: [id number]     Arrival date & time 05/21/14  1749 History   First MD Initiated Contact with Patient 05/21/14 1800     Chief Complaint  Patient presents with  . COPD     (Consider location/radiation/quality/duration/timing/severity/associated sxs/prior Treatment) HPI Pt with hx of COPD presenting with c/o shortness of breath. He states that he had been feeling improved after recent admission. But today was doing some housework and began to feel short of breath again.  He tried using his inhaler as well as his  Alb nebulizer without much relief.  He has also had a tight cough.  No fever/chills.  Pt went to Dr. Thurston HoleWert's office and received neb there without much relief, so was advised to come to the ED to receive steroids.  Denies chest pain but does feel his breathing is tight.  No leg swelling.  There are no other associated systemic symptoms, there are no other alleviating or modifying factors.   Past Medical History  Diagnosis Date  . Intermittent asthma   . Rhinitis   . COPD (chronic obstructive pulmonary disease)    Past Surgical History  Procedure Laterality Date  . Cholecystectomy  2009   No family history on file. History  Substance Use Topics  . Smoking status: Former Smoker -- 2.00 packs/day for 15 years    Types: Cigarettes    Quit date: 10/11/1987  . Smokeless tobacco: Never Used  . Alcohol Use: No    Review of Systems ROS reviewed and all otherwise negative except for mentioned in HPI    Allergies  Toradol  Home Medications   Prior to Admission medications   Medication Sig Start Date End Date Taking? Authorizing Provider  albuterol (PROAIR HFA) 108 (90 BASE) MCG/ACT inhaler Inhale 2 puffs into the lungs every 6 (six) hours as needed for wheezing. 2 puffs every 4 hours as needed only  if your can't catch your breath 05/03/14  Yes Belkys A Regalado, MD  albuterol (PROVENTIL) (2.5 MG/3ML) 0.083% nebulizer solution Take 3 mLs (2.5 mg total) by nebulization every  4 (four) hours as needed for wheezing or shortness of breath. 05/03/14  Yes Belkys A Regalado, MD  budesonide-formoterol (SYMBICORT) 160-4.5 MCG/ACT inhaler Inhale 2 puffs into the lungs 2 (two) times daily as needed (shortness of breath). 05/20/14  Yes Nyoka CowdenMichael B Wert, MD  famotidine (PEPCID) 20 MG tablet One at bedtime 05/08/14  Yes Nyoka CowdenMichael B Wert, MD  Multiple Vitamin (MULTIVITAMIN WITH MINERALS) TABS tablet Take 1 tablet by mouth daily.   Yes Historical Provider, MD  Phenyleph-Doxylamine-DM-APAP (ALKA SELTZER PLUS PO) Take 1 each by mouth once as needed (indegestion.).   Yes Historical Provider, MD  moxifloxacin (AVELOX) 400 MG tablet Take 1 tablet (400 mg total) by mouth daily at 8 pm. 05/21/14   Ethelda ChickMartha K Linker, MD  predniSONE (DELTASONE) 50 MG tablet Take 1 tablet (50 mg total) by mouth daily. 05/21/14   Ethelda ChickMartha K Linker, MD   BP 137/68  Pulse 108  Resp 16  SpO2 93% Vitals reviewed Physical Exam Physical Examination: General appearance - alert, well appearing, and in no distress Mental status - alert, oriented to person, place, and time Eyes - no conjunctival injection, no scleral icterus Mouth - mucous membranes moist, pharynx normal without lesions Chest - symmetric air movement, bilateral wheezes, rales or rhonchi, symmetric air entry Heart - normal rate, regular rhythm, normal S1, S2, no murmurs, rubs, clicks or gallops Abdomen - soft, nontender, nondistended, no masses or organomegaly Extremities -  peripheral pulses normal, no pedal edema, no clubbing or cyanosis Skin - normal coloration and turgor, no rashes  ED Course  Procedures (including critical care time) Labs Review Labs Reviewed  CBC - Abnormal; Notable for the following:    WBC 12.5 (*)    All other components within normal limits  BASIC METABOLIC PANEL - Abnormal; Notable for the following:    Glucose, Bld 110 (*)    BUN 28 (*)    GFR calc non Af Amer 57 (*)    GFR calc Af Amer 66 (*)    All other components within  normal limits  PRO B NATRIURETIC PEPTIDE - Abnormal; Notable for the following:    Pro B Natriuretic peptide (BNP) 198.2 (*)    All other components within normal limits    Imaging Review No results found.   EKG Interpretation None      MDM   Final diagnoses:  COPD exacerbation    Pt presenting with c/o wheezing and shortness of breath.  Pt with hx of COPD and inhaler at home and neb at Dr. Thurston Hole office was not much help.  Pt given steroids in the ED.  CXR reassuring.  Pt ambulated in the ED and O2 sat remained good.  Discharged with strict return precautions.  Pt agreeable with plan.    Ethelda Chick, MD 05/24/14 [phone]

## 2014-05-21 NOTE — ED Notes (Signed)
Pulse ox 95% consistently while ambulating. Denies shortness of breath while ambulating

## 2014-05-21 NOTE — Discharge Instructions (Signed)
Return to the ED with any concerns including difficulty breathing despite using albuterol every 4 hours, not drinking fluids, decreased urine output, vomiting and not able to keep down liquids or medications, decreased level of alertness/lethargy, or any other alarming symptoms °

## 2014-05-21 NOTE — Progress Notes (Signed)
Subjective:     Patient ID: Billy [name], male   DOB: 09/06/1946 .   MRN: [id number]   Brief patient profile:  68 yowm quit smoking 1990 with cr/ asthma and recurrent flares off meds due to nonadherence attributed to  insurance/cost issues and being "thick headed" (pt's description)   History of Present Illness  04/07/2011 Initial pulmonary office eval in EMR era p trip to ER 6/26 and placed on prednisone taper plus nebulizer and albuterol but not any maint rx,  Nasal obst symptoms predominate but has yet to see his ENT doc [name]) no purulent sputum.   >>symbicort rx   02/01/2012 Acute OV  Complains of increased SOB, tightness/pressure in chest for 2 weeks. Went to ED 4.16.13 and was given pred 60mg  and neb tx.  CXR showed NAD , was recommended to repeat for nipple markers re -left density ? Nipple shadow.  He does not have insurance or rx coverage.  Was seen ~1 year ago, dx with possible asthma , rx Symbicort.  He is self employed . No insurance will be on medicare in June this year.  Has a lot of nasal congestion , stuffiness and congestion . After took prednisone he felt much better , nasal stuffiness was much better. Feels great now . Breathing is much better. Nasal stuffiness is better.  FEV1 1.43 L , 41%, 45 ratio  rec Begin Symbicort 160/4.83mcg 2 puffs Twice daily  -brush/rinse and gargle  Nasonex 2 puffs Twice daily  - until sample is gone.  Saline nasal rinses As needed   Amoxicillin 500mg  Three times a day  For 10 days  Prednisone taper as directed.  I will call with xray results.  follow up [name]  In 2 months with PFT and As needed   Date of Admission: 11/24/2012   Date of Discharge: 11/26/2012  Attending Physician: [name], MD  Discharge Diagnosis:  Principal Problem:  COPD with acute exacerbation  Active Problems:  Influenza       10/28/2013 f/u ov/[name] re: chronic asthma/ non-adherent with symbicort / requesting saba samples Chief Complaint   Patient presents with  . Follow-up    Pt states his breathing is doing well. Has not had to use rescue inhaler since his last visit. No new co's today.   Says Only used the saba once 10/25/13 but "could I have another sample"  - stopped symbicort months prior to OV  And did ok until the last week or so prior to OV  With increasing nasal congestion and still has not scheduled surgery per Dr [name]. No purulent secrtions. Not limited from desired activities by sob rec See Dr [name] asap to get your nasal obstruction addressed and let him address what medications you need for it  Plan A = automatic =  Symbicort 160 Take 2 puffs first thing in am and then another 2 puffs about 12 hours later.  Plan B = Backup Only use your albuterol inhaler (Proair)as a rescue medication to be used if you can't catch your breath by resting or doing a relaxed purse lip breathing pattern.  - The less you use it, the better it will work when you need it. - Ok to use up to 2 puffs  every 4 hours if you must but call for immediate appointment if use goes up over your usual need - Don't leave home without it !!  (think of it like the spare tire for your car)  Plan C =  only use neb if you try the proair first and it doesn't work, but call me right away for appointment asap in event you start needed the neb S/p sinus surgery March 2015 by Dr [name] maintain on flonase  Stopped symbicort then sev days later woke up 4 am > to ER    05/08/2014 f/u ov/[name] re: poorly controlled asthma/ documented non-adherence Chief Complaint  Patient presents with  . HFU    Pt states breathing is back to normal baseline. He c/o chest discomfort for approx 1 wk- alkaseltzer helps.  He states that he was advised by the hospitalist to replace symbicort with albuterol daily.    could not articulate action plan which we have reviewed directly with him and given copies multiple times and focused instead on how badly the er treats him when  he goes  rec Plan A = automatic =  Symbicort 160 Take 2 puffs first thing in am and then another 2 puffs about 12 hours later. Plan B = Backup Only use your albuterol inhaler (Proair)as a rescue medication   Plan C = only use neb up to every 4 hours  if you try the proair first and it doesn't work, but call me right away for appointment asap in event you start needed the neb Plan D = Doctor, call if need to use the neb Plan E = ER only if all else fails  Add   pepcid 20 mg automatically at bedtime GERD  Diet  05/21/2014  Acute/ emergent walk in  ov/Cebastian Neis re: chest tight/ sob at rest  Chief Complaint  Patient presents with  . Acute Visit    Pt c/o increased chest tightness today. Also having some increased SOB.  He c/o non prod cough.    acutely 4pm day of ov while cleaning toilet got sweaty with gen  chest tightness,   But no nausea, no better with saba hfa then neb. Last took pred around one week prior to OV . No cough or sinus complaints.  No real warning. Had not needed any recent saba of any type, worked out for an hour one day prior to OV  s problem   No obvious daytime variabilty or assoc  Classically pleuritic or ex cp or c overt  hb symptoms. No unusual exp hx or h/o childhood pna/ asthma or knowledge of premature birth.  Sleeping ok without nocturnal  or early am exacerbation  of respiratory  c/o's or need for noct saba. Also denies any obvious fluctuation of symptoms with weather or environmental changes or other aggravating or alleviating factors except as outlined above   Current Medications, Allergies, Past Medical History, Past Surgical History, Family History, and Social History were reviewed in [employer] record.  ROS  The following are not active complaints unless bolded sore throat, dysphagia, dental problems, itching, sneezing,  nasal congestion or excess/ purulent secretions, ear ache,   fever, chills, sweats, unintended wt loss, pleuritic or  exertional cp, hemoptysis,  orthopnea pnd or leg swelling, presyncope, palpitations, heartburn, abdominal pain, anorexia, nausea, vomiting, diarrhea  or change in bowel or urinary habits, change in stools or urine, dysuria,hematuria,  rash, arthralgias, visual complaints, headache, numbness weakness or ataxia or problems with walking or coordination,  change in mood/affect or memory.                 Objective:   Physical Exam   amb wm nad   Wt 187 04/07/2011>>02/01/2012 196 > 04/02/2012  202 > 07/04/2012  202>  199 11/29/2012 > 203 03/20/2013 > 07/11/2013 204 > 10/28/2013  > 05/08/2014 210 > 05/21/2014  211    HEENT: nl dentition, turbinates, and orophanx. Nl external ear canals without cough reflex   NECK :  without JVD/Nodes/TM/ nl carotid upstrokes bilaterally   LUNGS: no acc muscle use,  Bilateral pan exp wheeze, obvious air stacking no better p xopenex x 1.25mg      CV:  RRR  no s3 or murmur or increase in P2, no edema   ABD:  soft and nontender with nl excursion in the supine position. No bruits or organomegaly, bowel sounds nl  MS:  warm without deformities, calf tenderness, cyanosis or clubbing  SKIN: warm and dry without lesions    NEURO:  alert, approp, no deficits    ekg > st no ischemic changes      Assessment:

## 2014-05-21 NOTE — Assessment & Plan Note (Signed)
No evidence of acute ischemia on ekg - suspect this is anxiety plus air stacking

## 2014-05-21 NOTE — ED Notes (Signed)
Per pt, has COPD, was seen at PCP and told to come here for steroids-O2 89%, placed of 3L 

## 2014-06-09 ENCOUNTER — Ambulatory Visit (INDEPENDENT_AMBULATORY_CARE_PROVIDER_SITE_OTHER): Payer: Medicare Other | Admitting: Internal Medicine

## 2014-06-09 ENCOUNTER — Encounter: Payer: Self-pay | Admitting: Internal Medicine

## 2014-06-09 VITALS — BP 130/78 | HR 73 | Temp 98.1°F | Ht 69.5 in | Wt 213.0 lb

## 2014-06-09 DIAGNOSIS — J449 Chronic obstructive pulmonary disease, unspecified: Secondary | ICD-10-CM

## 2014-06-09 LAB — PULMONARY FUNCTION TEST
DL/VA % PRED: 85 %
DL/VA: 3.94 ml/min/mmHg/L
DLCO unc % pred: 64 %
DLCO unc: 20.47 ml/min/mmHg
FEF 25-75 POST: 1.41 L/s
FEF 25-75 Pre: 0.63 L/sec
FEF2575-%CHANGE-POST: 123 %
FEF2575-%PRED-POST: 54 %
FEF2575-%PRED-PRE: 24 %
FEV1-%Change-Post: 44 %
FEV1-%PRED-PRE: 39 %
FEV1-%Pred-Post: 56 %
FEV1-Post: 1.87 L
FEV1-Pre: 1.3 L
FEV1FVC-%CHANGE-POST: 16 %
FEV1FVC-%PRED-PRE: 69 %
FEV6-%CHANGE-POST: 24 %
FEV6-%Pred-Post: 72 %
FEV6-%Pred-Pre: 58 %
FEV6-Post: 3.05 L
FEV6-Pre: 2.46 L
FEV6FVC-%Change-Post: 0 %
FEV6FVC-%Pred-Post: 102 %
FEV6FVC-%Pred-Pre: 102 %
FVC-%CHANGE-POST: 24 %
FVC-%PRED-POST: 70 %
FVC-%Pred-Pre: 57 %
FVC-PRE: 2.53 L
FVC-Post: 3.14 L
POST FEV1/FVC RATIO: 60 %
PRE FEV1/FVC RATIO: 51 %
PRE FEV6/FVC RATIO: 97 %
Post FEV6/FVC ratio: 97 %
RV % PRED: 133 %
RV: 3.15 L
TLC % pred: 83 %
TLC: 5.79 L

## 2014-06-09 NOTE — Progress Notes (Signed)
Subjective:     Patient ID: Billy [name], male   DOB: 1946-09-19 .   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   >>symbicort rx    10/28/2013 f/u ov/Maryjo Ragon re: chronic asthma/ non-adherent with symbicort / requesting saba samples Chief Complaint  Patient presents with  . Follow-up    Pt states his breathing is doing well. Has not had to use rescue inhaler since his last visit. No new co's today.   Says Only used the saba once 10/25/13 but "could I have another sample"  - stopped symbicort months prior to OV  And did ok until the last week or so prior to OV  With increasing nasal congestion and still has not scheduled surgery per Dr [name]. No purulent secrtions. Not limited from desired activities by sob rec See Dr [name] asap to get your nasal obstruction addressed and let him address what medications you need for it  Plan A = automatic =  Symbicort 160 Take 2 puffs first thing in am and then another 2 puffs about 12 hours later.  Plan B = Backup Only use your albuterol inhaler (Proair)as a rescue medication to be used if you can't catch your breath by resting or doing a relaxed purse lip breathing pattern.  - The less you use it, the better it will work when you need it. - Ok to use up to 2 puffs  every 4 hours if you must but call for immediate appointment if use goes up over your usual need - Don't leave home without it !!  (think of it like the spare tire for your  car)  Plan C = only use neb if you try the proair first and it doesn't work, but call me right away for appointment asap in event you start needed the neb S/p sinus surgery March 2015 by Dr [name] maintain on flonase  Stopped symbicort then sev days later woke up 4 am > to ER 05/02/14    05/08/2014 hosp  f/u ov/Teagan Heidrick re: poorly controlled asthma/ documented non-adherence Chief Complaint  Patient presents with  . HFU    Pt states breathing is back to normal baseline. He c/o chest discomfort for approx 1 wk- alkaseltzer helps.  He states that he was advised by the hospitalist to replace symbicort with albuterol daily.    could not articulate action plan which we have reviewed directly with him and given copies multiple times and focused instead on how badly the er treats him when he goes  rec Plan A = automatic =  Symbicort 160 Take 2 puffs first thing in am and then another 2 puffs about 12 hours later. Plan B = Backup Only use your albuterol inhaler (Proair)as a rescue medication   Plan C = only use neb up to every 4 hours  if you try the proair first and it doesn't work, but call me right away for appointment asap in event you  start needed the neb Plan D = Doctor, call if need to use the neb Plan E = ER only if all else fails  Add   pepcid 20 mg automatically at bedtime GERD  Diet  05/21/2014  Acute/ emergent walk in  ov/Hopie Pellegrin re: chest tight/ sob at rest  Chief Complaint  Patient presents with  . Acute Visit    Pt c/o increased chest tightness today. Also having some increased SOB.  He c/o non prod cough.   Acutely 4pm day of ov while cleaning toilet got sweaty with gen  chest tightness,   But no nausea, no better with saba hfa then neb. Last took pred around one week prior to OV . No cough or sinus complaints.  No real warning. Had not needed any recent saba of any type, worked out for an hour one day prior to OV  s problem rec Go to ER > rx with nebs/steroids and  d/c    8/31/2015post ER f/u ov/Cortez Steelman re: GOLD II copd/asthma overlap Chief Complaint  Patient presents with  . Follow-up    PFT done today. Pt states that his breathing is much improved since the last visit.  No new co's today.  not on maint pred, just the symbicort 160 2 bid  Not  using rescue inhaler since er and no neb saba either   No obvious daytime variabilty or assoc  Classically pleuritic or ex cp or c overt  hb symptoms. No unusual exp hx or h/o childhood pna/ asthma or knowledge of premature birth.  Sleeping ok without nocturnal  or early am exacerbation  of respiratory  c/o's or need for noct saba. Also denies any obvious fluctuation of symptoms with weather or environmental changes or other aggravating or alleviating factors except as outlined above   Current Medications, Allergies, Past Medical History, Past Surgical History, Family History, and Social History were reviewed in [employer] record.  ROS  The following are not active complaints unless bolded sore throat, dysphagia, dental problems, itching, sneezing,  Less nasal congestion or excess/ purulent secretions, ear ache,   fever, chills, sweats, unintended wt loss, pleuritic or exertional cp, hemoptysis,  orthopnea pnd or leg swelling, presyncope, palpitations, heartburn, abdominal pain, anorexia, nausea, vomiting, diarrhea  or change in bowel or urinary habits, change in stools or urine, dysuria,hematuria,  rash, arthralgias, visual complaints, headache, numbness weakness or ataxia or problems with walking or coordination,  change in mood/affect or memory.                 Objective:   Physical Exam   amb wm nad   Wt 187 04/07/2011>>02/01/2012 196 > 04/02/2012  202 > 07/04/2012  202>  199 11/29/2012 > 203 03/20/2013 > 07/11/2013 204 > 10/28/2013  > 05/08/2014 210 > 05/21/2014  211 > 06/09/2014  213    HEENT: nl dentition, turbinates, and orophanx. Nl external ear canals without cough  reflex   NECK :  without JVD/Nodes/TM/ nl carotid upstrokes bilaterally   LUNGS: no acc muscle use,   Clear p saba  CV:  RRR  no s3 or murmur or increase in P2, no edema   ABD:  soft and nontender with nl excursion in the supine position. No bruits or organomegaly, bowel sounds nl  MS:  warm without deformities, calf tenderness, cyanosis or clubbing  SKIN: warm and dry without lesions    NEURO:  alert, approp, no deficits     cxr 05/21/14 Cardiomediastinal silhouette is stable.  No acute infiltrate or  pleural effusion. No pulmonary edema. Mild hyperinflation.   Lab Results  Component Value Date   WBC 12.5* 05/21/2014   HGB 15.1 05/21/2014   HCT 46.1 05/21/2014   MCV 89.5 05/21/2014   PLT 235 05/21/2014       Chemistry      Component Value Date/Time   NA 141 05/21/2014 1812   K 4.4 05/21/2014 1812   CL 100 05/21/2014 1812   CO2 26 05/21/2014 1812   BUN 28* 05/21/2014 1812   CREATININE 1.26 05/21/2014 1812      Component Value Date/Time   CALCIUM 10.3 05/21/2014 1812   ALKPHOS 73 11/25/2012 0535   AST 23 11/25/2012 0535   ALT 20 11/25/2012 0535   BILITOT 0.3 11/25/2012 0535       Lab Results  Component Value Date   PROBNP 198.2* 05/21/2014     Lab Results  Component Value Date   CKTOTAL 97 04/05/2011   CKMB 5.1* 04/05/2011   TROPONINI <0.30 05/02/2014        Assessment:

## 2014-06-09 NOTE — Assessment & Plan Note (Signed)
-    PFT's 03/16/2007 FEV1  2.29 (70%) ratio 57 with 10% resp to B2 and DLCO 86    - PFT's 07/04/2012 FEV1 1.02 (33%) 1.61 p B2 (58%) and DLCO 76%   -  PFT's 03/20/2013  FEV 1.96 (58%) and no better p B2 and DLCO 81%    -  PFTs 06/09/2014  FEV1  1.87 (56%) with ratio 60 p 44% improvement from saba and no symbicort prior, dlco 64% corrects to 85%     DDX of  difficult airways management all start with A and  include Adherence, Ace Inhibitors, Acid Reflux, Active Sinus Disease, Alpha 1 Antitripsin deficiency, Anxiety masquerading as Airways dz,  ABPA,  allergy(esp in young), Aspiration (esp in elderly), Adverse effects of DPI,  Active smokers, plus two Bs  = Bronchiectasis and Beta blocker use..and one C= CHF  -The proper method of use, as well as anticipated side effects, of a metered-dose inhaler are discussed and demonstrated to the patient. Improved effectiveness after extensive coaching during this visit to a level of approximately  90% - reviewed access to symbiocort > says no problem as of first of September, admits in past tried multiple times to ration it out due to expense and then ended up in ER (penny wise and pound foolish concept reviewed)  Active sinus dz > rx [name], improved   ? Acid (or non-acid) GERD > always difficult to exclude as up to 75% of pts in some series report no assoc GI/ Heartburn symptoms> rec max noct  acid suppression and diet restrictions/ reviewed and instructions given in writing.   F/u q 3 m sooner prn

## 2014-06-09 NOTE — Patient Instructions (Signed)
No change in recommendations  Please schedule a follow up visit in 3 months but call sooner if needed  

## 2014-06-09 NOTE — Progress Notes (Signed)
PFT done today. 

## 2014-07-16 ENCOUNTER — Telehealth: Payer: Self-pay | Admitting: Internal Medicine

## 2014-07-16 NOTE — Telephone Encounter (Signed)
We rec'd death certificate for Dr. Sherene SiresWert to sign 07/14/14. Call funeral home to pick up 07/16/2014

## 2014-07-16 NOTE — Telephone Encounter (Signed)
Contacted by paramedics pt found dead in bed Referred to ME  ME called Uvaldo Rising([place]) and requested I sign death certificate as natural causes after acknowledging we could not be certain whether this was cardiac vs pulmonary cause.  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

## 2014-08-10 DEATH — deceased

## 2014-09-08 ENCOUNTER — Ambulatory Visit: Payer: Medicare Other | Admitting: Internal Medicine
